# Patient Record
Sex: Male | Born: 1960 | Race: Black or African American | Hispanic: No | Marital: Married | Smoking: Never smoker
Health system: Southern US, Community
[De-identification: ages and names within clinical notes are randomized; demographics above are authoritative.]

## PROBLEM LIST (undated history)

## (undated) DIAGNOSIS — Z91018 Allergy to other foods: Principal | ICD-10-CM

## (undated) DIAGNOSIS — K219 Gastro-esophageal reflux disease without esophagitis: Secondary | ICD-10-CM

## (undated) DIAGNOSIS — I10 Essential (primary) hypertension: Secondary | ICD-10-CM

## (undated) DIAGNOSIS — K5792 Diverticulitis of intestine, part unspecified, without perforation or abscess without bleeding: Secondary | ICD-10-CM

## (undated) DIAGNOSIS — I313 Pericardial effusion (noninflammatory): Secondary | ICD-10-CM

## (undated) DIAGNOSIS — Z5189 Encounter for other specified aftercare: Secondary | ICD-10-CM

## (undated) DIAGNOSIS — T7840XA Allergy, unspecified, initial encounter: Secondary | ICD-10-CM

## (undated) DIAGNOSIS — G4730 Sleep apnea, unspecified: Secondary | ICD-10-CM

## (undated) HISTORY — DX: Essential (primary) hypertension: I10

## (undated) HISTORY — DX: Diverticulitis of intestine, part unspecified, without perforation or abscess without bleeding: K57.92

## (undated) HISTORY — PX: HERNIA REPAIR: SHX51

## (undated) HISTORY — DX: Gastro-esophageal reflux disease without esophagitis: K21.9

## (undated) HISTORY — DX: Sleep apnea, unspecified: G47.30

## (undated) HISTORY — DX: Pericardial effusion (noninflammatory): I31.3

## (undated) HISTORY — DX: Encounter for other specified aftercare: Z51.89

## (undated) HISTORY — PX: ACHILLES TENDON SURGERY: SHX542

## (undated) HISTORY — DX: Allergy, unspecified, initial encounter: T78.40XA

## (undated) HISTORY — DX: Allergy to other foods: Z91.018

---

## 2011-10-23 DIAGNOSIS — K5792 Diverticulitis of intestine, part unspecified, without perforation or abscess without bleeding: Secondary | ICD-10-CM

## 2011-10-23 HISTORY — DX: Diverticulitis of intestine, part unspecified, without perforation or abscess without bleeding: K57.92

## 2015-05-24 ENCOUNTER — Ambulatory Visit: Payer: Self-pay | Admitting: Family Medicine

## 2015-06-08 ENCOUNTER — Ambulatory Visit (INDEPENDENT_AMBULATORY_CARE_PROVIDER_SITE_OTHER): Payer: 59 | Admitting: Family Medicine

## 2015-06-08 ENCOUNTER — Encounter: Payer: Self-pay | Admitting: Family Medicine

## 2015-06-08 VITALS — BP 148/92 | HR 87 | Temp 98.1°F | Resp 18 | Ht 66.0 in | Wt 255.6 lb

## 2015-06-08 DIAGNOSIS — E7800 Pure hypercholesterolemia, unspecified: Secondary | ICD-10-CM

## 2015-06-08 DIAGNOSIS — R7309 Other abnormal glucose: Secondary | ICD-10-CM | POA: Diagnosis not present

## 2015-06-08 DIAGNOSIS — K219 Gastro-esophageal reflux disease without esophagitis: Secondary | ICD-10-CM | POA: Insufficient documentation

## 2015-06-08 DIAGNOSIS — G4733 Obstructive sleep apnea (adult) (pediatric): Secondary | ICD-10-CM

## 2015-06-08 DIAGNOSIS — I10 Essential (primary) hypertension: Secondary | ICD-10-CM

## 2015-06-08 DIAGNOSIS — E66813 Obesity, class 3: Secondary | ICD-10-CM

## 2015-06-08 DIAGNOSIS — R7303 Prediabetes: Secondary | ICD-10-CM

## 2015-06-08 DIAGNOSIS — Z125 Encounter for screening for malignant neoplasm of prostate: Secondary | ICD-10-CM

## 2015-06-08 DIAGNOSIS — F411 Generalized anxiety disorder: Secondary | ICD-10-CM

## 2015-06-08 DIAGNOSIS — Z8719 Personal history of other diseases of the digestive system: Secondary | ICD-10-CM | POA: Diagnosis not present

## 2015-06-08 DIAGNOSIS — B351 Tinea unguium: Secondary | ICD-10-CM

## 2015-06-08 MED ORDER — BUPROPION HCL ER (XL) 150 MG PO TB24: 150.0000 mg | ORAL_TABLET | Freq: Every day | ORAL | Status: DC

## 2015-06-08 MED ORDER — OMEPRAZOLE 40 MG PO CPDR: 40.0000 mg | DELAYED_RELEASE_CAPSULE | Freq: Every day | ORAL | Status: DC

## 2015-06-08 NOTE — Progress Notes (Signed)
Name: Kevin Marshall   MRN: [id number]    DOB: November 03, 1960   Date:06/08/2015       Progress Note  Subjective  Chief Complaint  Chief Complaint  Patient presents with  . Establish Care    HPI  Kevin Marshall is a 54 year old male who is here to establish care and discuss several things he needs addressed.  He has long standing sleep apnea for which he uses his CPAP machine however he may need a new device in the near future. He has already had his C-scope during an emergency in 2014 where he had unprovoked GI bleed which the source was difficult to determine. He had a C-scope, EGD and capsule camera testing done with no definitive findings so diverticular bleeding was accounted for the bleeding. He did need blood transfusion.  Other than that he has some current toe nail discoloration on 1st digits of both feet for which he would like treatment.  He has recent metabolic screening through [employer] which was reasonable other than pre-diabetes, elevated LDL cholesterol, his BMI and waist circumference and blood pressure. His max weight was around 280lbs and since has lost about 30 lbs with diet changes.   Hypertension: Patient is here for evaluation of elevated blood pressures.  Age at onset of elevated blood pressure: first noticed on his recent [facility] physical. Cardiac symptoms none. Patient denies chest pain, chest pressure/discomfort, claudication, dyspnea, exertional chest pressure/discomfort, irregular heart beat, lower extremity edema, near-syncope and palpitations.  Cardiovascular risk factors: hypertension, male gender, obesity (BMI >= 30 kg/m2) and sedentary lifestyle. Use of agents associated with hypertension: none. History of target organ damage: none.  GERD: Paitent complains of heartburn. This has been associated with abdominal bloating, belching, heartburn and midespigastric pain.  He denies chest pain, choking on food, cough, hoarseness, melena, nausea and nocturnal burning. Symptoms  have been present for several years. He denies dysphagia.  He has not lost weight un-intentionally. He denies melena, hematochezia, hematemesis, and coffee ground emesis. Medical therapy in the past has included antacids, H2 antagonists and proton pump inhibitors.  Needs refill.  Anxiety: Patient complains of anxiety disorder.  He has the following symptoms: difficulty concentrating, fatigue, feelings of losing control, insomnia, irritable, racing thoughts. Onset of symptoms was approximately several years ago, stable since that time. He denies current suicidal and homicidal ideation. Family history significant for anxiety and depression.Possible organic causes contributing are: none. 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  He complains of the following side effects from the treatment: none.  Wants to re-start Wellbutrin XR formulation preferred.         Patient Active Problem List   Diagnosis Date Noted  . Hypertension goal BP (blood pressure) < 140/90 06/08/2015  . GERD without esophagitis 06/08/2015  . Depression with anxiety 06/08/2015    Social History  Substance Use Topics  . Smoking status: Never Smoker   . Smokeless tobacco: Not on file  . Alcohol Use: 0.0 oz/week    0 Standard drinks or equivalent per week     Comment: social     Current outpatient prescriptions:  .  buPROPion (WELLBUTRIN) 75 MG tablet, Take 75 mg by mouth 2 (two) times daily., Disp: , Rfl:  .  esomeprazole (NEXIUM) 40 MG capsule, Take 40 mg by mouth daily at 12 noon., Disp: , Rfl:   Past Surgical History  Procedure Laterality Date  . Achilles tendon surgery    . Hernia repair  3    Family History  Problem Relation Age of Onset  . Diabetes Mother   . Hypertension Father     No Known Allergies   Review of Systems  CONSTITUTIONAL: No fever, chills, weakness or fatigue.  HEENT:  - Eyes: No visual changes.  - Ears: No auditory changes. No  pain.  - Nose: No sneezing, congestion, runny nose. - Throat: No sore throat. No changes in swallowing. SKIN: No rash or itching.  CARDIOVASCULAR: No chest pain, chest pressure or chest discomfort. No palpitations or edema.  RESPIRATORY: No shortness of breath, cough or sputum.  GASTROINTESTINAL: No anorexia, nausea, vomiting. No changes in bowel habits. No abdominal pain or blood.  GENITOURINARY: No dysuria. No frequency. No discharge. NEUROLOGICAL: No headache, dizziness, syncope, paralysis, ataxia, numbness or tingling in the extremities. No memory changes. No change in bowel or bladder control.  MUSCULOSKELETAL: No joint pain. No muscle pain. HEMATOLOGIC: No anemia, bleeding or bruising.  LYMPHATICS: No enlarged lymph nodes.  PSYCHIATRIC: No change in mood. No change in sleep pattern.  ENDOCRINOLOGIC: No reports of sweating, cold or heat intolerance. No polyuria or polydipsia.     Objective  BP 148/92 mmHg  Pulse 87  Temp(Src) 98.1 F (36.7 C) (Oral)  Resp 18  Ht 5\' 6"  (1.676 m)  Wt 255 lb 9.6 oz (115.939 kg)  BMI 41.27 kg/m2  SpO2 98% Body mass index is 41.27 kg/(m^2).  Physical Exam  Constitutional: Patient is obese and well-nourished. In no distress.  HEENT:  - Head: Normocephalic and atraumatic.  - Ears: Bilateral TMs gray, no erythema or effusion - Nose: Nasal mucosa moist - Mouth/Throat: Oropharynx is clear and moist. No tonsillar hypertrophy or erythema. No post nasal drainage.  - Eyes: Conjunctivae clear, EOM movements normal. PERRLA. No scleral icterus.  Neck: Normal range of motion. Neck supple. No JVD present. No thyromegaly present.  Cardiovascular: Normal rate, regular rhythm and normal heart sounds.  No murmur heard.  Pulmonary/Chest: Effort normal and breath sounds normal. No respiratory distress. Musculoskeletal: Normal range of motion bilateral UE and LE, no joint effusions. Peripheral vascular: Bilateral LE no edema. Neurological: CN II-XII grossly  intact with no focal deficits. Alert and oriented to person, place, and time. Coordination, balance, strength, speech and gait are normal.  Skin: Skin is warm and dry. No rash noted. No erythema. Bilateral 1st digit nails of both feet discolored brown and mildly brittle.  Psychiatric: Patient has a normal mood and affect. Behavior is normal in office today. Judgment and thought content normal in office today.   Assessment & Plan  1. Hypertension goal BP (blood pressure) < 140/90 New diagnosis. He wants to try weight loss and lifestyle changes first but I have educated him on long term consequences of elevated BP especially diastolic and risks of heart failure.   - Hepatic function panel - TSH  2. GERD without esophagitis Clinically stable findings based on clinical exam and on review of any pertinent results. Recommended to patient that they continue their current regimen with regular follow ups.  - omeprazole (PRILOSEC) 40 MG capsule; Take 1 capsule (40 mg total) by mouth daily.  Dispense: 30 capsule; Refill: 3  3. GAD (generalized anxiety disorder) Restart Wellbutrin.  - buPROPion (WELLBUTRIN XL) 150 MG 24 hr tablet; Take 1 tablet (150 mg total) by mouth daily.  Dispense: 30 tablet; Refill: 3  4. Obstructive sleep apnea of adult Weight loss recommended.  5. Onychomycosis of toenail If LFTs reasonable on blood work I  will start him on Terbinafine.   6. History of GI diverticular bleed Clinically stable findings based on clinical exam and on review of any pertinent results. Recommended repeat C-scope which will be ordered during upcoming CPE.    7. Obesity, Class III, BMI 40-49.9 (morbid obesity) The patient has been counseled on their higher than normal BMI.  They have verbally expressed understanding their increased risk for other diseases.  In efforts to meet a better target BMI goal the patient has been counseled on lifestyle, diet and exercise modification tactics. Start with  moderate intensity aerobic exercise (walking, jogging, elliptical, swimming, group or individual sports, hiking) at least a day at least 4 days a week and increase intensity, duration, frequency as tolerated. Diet should include well balance fresh fruits and vegetables avoiding processed foods, carbohydrates and sugars. Drink at least 8oz 10 glasses a day avoiding sodas, sugary fruit drinks, sweetened tea. Check weight on a reliable scale daily and monitor weight loss progress daily. Consider investing in mobile phone apps that will help keep track of weight loss goals.  - Hepatic function panel  8. Pre-diabetes Weight loss recommended.   9. Elevated LDL cholesterol level Weight loss recommended.   10. Prostate cancer screening  - PSA

## 2015-06-09 ENCOUNTER — Encounter: Payer: Self-pay | Admitting: Family Medicine

## 2015-06-18 LAB — HEPATIC FUNCTION PANEL
ALK PHOS: 86 IU/L (ref 39–117)
ALT: 35 IU/L (ref 0–44)
AST: 31 IU/L (ref 0–40)
Albumin: 4.6 g/dL (ref 3.5–5.5)
BILIRUBIN, DIRECT: 0.14 mg/dL (ref 0.00–0.40)
Bilirubin Total: 0.5 mg/dL (ref 0.0–1.2)
TOTAL PROTEIN: 7.8 g/dL (ref 6.0–8.5)

## 2015-06-18 LAB — PSA: PROSTATE SPECIFIC AG, SERUM: 0.5 ng/mL (ref 0.0–4.0)

## 2015-06-18 LAB — TSH: TSH: 1.7 u[IU]/mL (ref 0.450–4.500)

## 2015-07-01 ENCOUNTER — Ambulatory Visit (INDEPENDENT_AMBULATORY_CARE_PROVIDER_SITE_OTHER): Payer: 59 | Admitting: Family Medicine

## 2015-07-01 ENCOUNTER — Encounter: Payer: Self-pay | Admitting: Family Medicine

## 2015-07-01 VITALS — BP 156/108 | HR 87 | Temp 98.5°F | Resp 18 | Wt 256.9 lb

## 2015-07-01 DIAGNOSIS — L309 Dermatitis, unspecified: Secondary | ICD-10-CM | POA: Diagnosis not present

## 2015-07-01 DIAGNOSIS — B351 Tinea unguium: Secondary | ICD-10-CM | POA: Diagnosis not present

## 2015-07-01 DIAGNOSIS — I10 Essential (primary) hypertension: Secondary | ICD-10-CM

## 2015-07-01 MED ORDER — LISINOPRIL 20 MG PO TABS: 20.0000 mg | ORAL_TABLET | Freq: Every day | ORAL | Status: DC

## 2015-07-01 MED ORDER — TERBINAFINE HCL 250 MG PO TABS: 250.0000 mg | ORAL_TABLET | Freq: Every day | ORAL | Status: DC

## 2015-07-01 MED ORDER — CLOTRIMAZOLE-BETAMETHASONE 1-0.05 % EX LOTN: TOPICAL_LOTION | Freq: Two times a day (BID) | CUTANEOUS | Status: DC

## 2015-07-01 NOTE — Progress Notes (Signed)
Name: Kevin Marshall   MRN: [id number]    DOB: 06/26/1961   Date:07/01/2015       Progress Note  Subjective  Chief Complaint  Chief Complaint  Patient presents with  . Advice Only    patient needs form completed for work  . Medication Management    HPI  Mr. Kevin Marshall is a 54 year old male who is here accompanied by his wife  to get [facility] form filled out regarding his BMI.  He has recent metabolic screening through [employer] [facility] which was reasonable other than pre-diabetes, elevated LDL cholesterol, his BMI and waist circumference and blood pressure. His max weight was around 280lbs and since has lost about 30 lbs with diet changes. He also has OSA, history of spontaneous GI bleed, GERD, anxiety, eczema.  Eczema manifests as dry patches of skin over eye browns, sides of nose and on earlobes. Needs topical lotion refilled. Other than that he has some current toe nail discoloration on 1st digits of both feet for which he would like treatment.Onset over 1 year ago. Has tried OTC topical solutions which have not helped much.  Kevin Marshall is also here for evaluation of elevated blood pressures. Age at onset of elevated blood pressure: first noticed on his recent [facility] physical. Cardiac symptoms none. Patient denies chest pain, chest pressure/discomfort, claudication, dyspnea, exertional chest pressure/discomfort, irregular heart beat, lower extremity edema, near-syncope and palpitations. Cardiovascular risk factors: hypertension, male gender, obesity (BMI >= 30 kg/m2) and sedentary lifestyle. Use of agents associated with hypertension: none. History of target organ damage: none.  He is agreeable to starting anti-HTN medication today as his BP has been elevated on at least 3 different recorded occasions.    Patient Active Problem List   Diagnosis Date Noted  . Hypertension goal BP (blood pressure) < 140/90 06/08/2015  . GERD without esophagitis 06/08/2015  . GAD (generalized anxiety disorder)  06/08/2015  . Obstructive sleep apnea of adult 06/08/2015  . Onychomycosis of toenail 06/08/2015  . History of GI diverticular bleed 06/08/2015  . Obesity, Class III, BMI 40-49.9 (morbid obesity) 06/08/2015  . Pre-diabetes 06/08/2015  . Elevated LDL cholesterol level 06/08/2015  . Prostate cancer screening 06/08/2015    Social History  Substance Use Topics  . Smoking status: Never Smoker   . Smokeless tobacco: Not on file  . Alcohol Use: 0.0 oz/week    0 Standard drinks or equivalent per week     Comment: social     Current outpatient prescriptions:  .  buPROPion (WELLBUTRIN XL) 150 MG 24 hr tablet, Take 1 tablet (150 mg total) by mouth daily., Disp: 30 tablet, Rfl: 3 .  omeprazole (PRILOSEC) 40 MG capsule, Take 1 capsule (40 mg total) by mouth daily., Disp: 30 capsule, Rfl: 3  Past Surgical History  Procedure Laterality Date  . Achilles tendon surgery    . Hernia repair      3    Family History  Problem Relation Age of Onset  . Diabetes Mother   . Hypertension Father     No Known Allergies   Review of Systems  CONSTITUTIONAL: No significant weight changes, fever, chills, weakness or fatigue.  HEENT:  - Eyes: No visual changes.  - Ears: No auditory changes. No pain.  - Nose: No sneezing, congestion, runny nose. - Throat: No sore throat. No changes in swallowing. SKIN: No rash or itching. Propensity for eczema, nail changes.   CARDIOVASCULAR: No chest pain, chest pressure or chest discomfort. No palpitations or edema.  RESPIRATORY: No shortness of breath, cough or sputum.  GASTROINTESTINAL: No anorexia, nausea, vomiting. No changes in bowel habits. No abdominal pain or blood.  GENITOURINARY: No dysuria. No frequency. No discharge.  NEUROLOGICAL: No headache, dizziness, syncope, paralysis, ataxia, numbness or tingling in the extremities. No memory changes. No change in bowel or bladder control.  MUSCULOSKELETAL: No joint pain. No muscle pain. HEMATOLOGIC: No  anemia, bleeding or bruising.  LYMPHATICS: No enlarged lymph nodes.  PSYCHIATRIC: No change in mood. No change in sleep pattern.  ENDOCRINOLOGIC: No reports of sweating, cold or heat intolerance. No polyuria or polydipsia.     Objective  BP 156/108 mmHg  Pulse 87  Temp(Src) 98.5 F (36.9 C) (Oral)  Resp 18  Wt 256 lb 14.4 oz (116.529 kg)  SpO2 96% Body mass index is 41.48 kg/(m^2).  Physical Exam  Constitutional: Patient is obese and well-nourished. In no distress.  HEENT:  - Head: Normocephalic and atraumatic.  - Ears: Bilateral TMs gray, no erythema or effusion - Nose: Nasal mucosa moist - Mouth/Throat: Oropharynx is clear and moist. No tonsillar hypertrophy or erythema. No post nasal drainage.  - Eyes: Conjunctivae clear, EOM movements normal. PERRLA. No scleral icterus.  Neck: Normal range of motion. Neck supple. No JVD present. No thyromegaly present.  Cardiovascular: Normal rate, regular rhythm and normal heart sounds.  No murmur heard.  Pulmonary/Chest: Effort normal and breath sounds normal. No respiratory distress. Musculoskeletal: Normal range of motion bilateral UE and LE, no joint effusions. Peripheral vascular: Bilateral LE no edema. Neurological: CN II-XII grossly intact with no focal deficits. Alert and oriented to person, place, and time. Coordination, balance, strength, speech and gait are normal.  Skin: Skin on face with patches of mild dryness at forehead and earlobes. Bilateral feet 1st digit nails discolored brown, thick, mildly brittle.  Psychiatric: Patient has a stable mood and affect. Behavior is normal in office today. Judgment and thought content normal in office today.   Recent Results (from the past 2160 hour(s))  Hepatic function panel     Status: None   Collection Time: 06/17/15  3:19 PM  Result Value Ref Range   Total Protein 7.8 6.0 - 8.5 g/dL   Albumin 4.6 3.5 - 5.5 g/dL   Bilirubin Total 0.5 0.0 - 1.2 mg/dL   Bilirubin, Direct 1.61 0.00 -  0.40 mg/dL   Alkaline Phosphatase 86 39 - 117 IU/L   AST 31 0 - 40 IU/L   ALT 35 0 - 44 IU/L  PSA     Status: None   Collection Time: 06/17/15  3:19 PM  Result Value Ref Range   Prostate Specific Ag, Serum 0.5 0.0 - 4.0 ng/mL    Comment: Roche ECLIA methodology. According to the [employer] U[employer] Association, Serum PSA should decrease and remain at undetectable levels after radical prostatectomy. The AUA defines biochemical recurrence as an initial PSA value 0.2 ng/mL or greater followed by a subsequent confirmatory PSA value 0.2 ng/mL or greater. Values obtained with different assay methods or kits cannot be used interchangeably. Results cannot be interpreted as absolute evidence of the presence or absence of malignant disease.   TSH     Status: None   Collection Time: 06/17/15  3:19 PM  Result Value Ref Range   TSH 1.700 0.450 - 4.500 uIU/mL     Assessment & Plan  1. Obesity, Class III, BMI 40-49.9 (morbid obesity) [facility] form reviewed and signed with physician recommended weight loss plan. Recommended routine follow up 3-4 months for  surveillance of weight loss.   The patient has been counseled on their higher than normal BMI.  They have verbally expressed understanding their increased risk for other diseases.  In efforts to meet a better target BMI goal the patient has been counseled on lifestyle, diet and exercise modification tactics. Start with moderate intensity aerobic exercise (walking, jogging, elliptical, swimming, group or individual sports, hiking) at least a day at least 4 days a week and increase intensity, duration, frequency as tolerated. Diet should include well balance fresh fruits and vegetables avoiding processed foods, carbohydrates and sugars. Drink at least 8oz 10 glasses a day avoiding sodas, sugary fruit drinks, sweetened tea. Check weight on a reliable scale daily and monitor weight loss progress daily. Consider investing in mobile phone apps that  will help keep track of weight loss goals.   2. Onychomycosis of toenail Trial of Terbinafine. The patient has been counseled on the proper use, side effects and potential interactions of the new medication. Patient encouraged to review the side effects and safety profile pamphlet provided with the prescription from the pharmacy as well as request counseling from the pharmacy team as needed.   - terbinafine (LAMISIL) 250 MG tablet; Take 1 tablet (250 mg total) by mouth daily.  Dispense: 90 tablet; Refill: 0  3. Hypertension goal BP (blood pressure) < 140/90 Sub optimal control. Start Lisinopril 20 mg one a day. The patient has been counseled on the proper use, side effects and potential interactions of the new medication. Patient encouraged to review the side effects and safety profile pamphlet provided with the prescription from the pharmacy as well as request counseling from the pharmacy team as needed.   - lisinopril (PRINIVIL,ZESTRIL) 20 MG tablet; Take 1 tablet (20 mg total) by mouth daily.  Dispense: 90 tablet; Refill: 3  4. Eczematous dermatitis Well controled with current treatment. Refill topical treatment.   - clotrimazole-betamethasone (LOTRISONE) lotion; Apply topically 2 (two) times daily.  Dispense: 30 mL; Refill: 0

## 2015-07-12 ENCOUNTER — Encounter: Payer: Self-pay | Admitting: Family Medicine

## 2015-07-12 ENCOUNTER — Ambulatory Visit (INDEPENDENT_AMBULATORY_CARE_PROVIDER_SITE_OTHER): Payer: 59 | Admitting: Family Medicine

## 2015-07-12 VITALS — BP 142/86 | HR 90 | Temp 98.1°F | Resp 18 | Ht 66.0 in | Wt 259.0 lb

## 2015-07-12 DIAGNOSIS — Z23 Encounter for immunization: Secondary | ICD-10-CM

## 2015-07-12 DIAGNOSIS — Z8719 Personal history of other diseases of the digestive system: Secondary | ICD-10-CM | POA: Insufficient documentation

## 2015-07-12 DIAGNOSIS — Z0000 Encounter for general adult medical examination without abnormal findings: Secondary | ICD-10-CM

## 2015-07-12 DIAGNOSIS — Z9889 Other specified postprocedural states: Secondary | ICD-10-CM

## 2015-07-12 NOTE — Progress Notes (Signed)
Name: Kevin Marshall   MRN: [id number]    DOB: 1961-02-22   Date:07/12/2015       Progress Note  Subjective  Chief Complaint  Chief Complaint  Patient presents with  . Annual Exam    HPI  Patient is here today for a Complete Male Physical Exam:  The patient has no acute concerns. Overall feels that his health needs are heading in the right direction. Working on getting diet to be more well balanced. In general does not exercise regularly. Sees dentist regularly and addresses vision concerns with ophthalmologist if applicable. In regards to sexual activity the patient is currently sexually active. Currently is not concerned about exposure to any STDs.    Past Medical History  Diagnosis Date  . Allergy     history of as a child, did take shots  . Blood transfusion without reported diagnosis   . GERD (gastroesophageal reflux disease)   . Sleep apnea   . Hypertension     borderline history of  . Diverticulitis 2013    patient was hospitalized for over a week for this    Past Surgical History  Procedure Laterality Date  . Achilles tendon surgery    . Hernia repair      3    Family History  Problem Relation Age of Onset  . Diabetes Mother   . Hypertension Father     Social History   Social History  . Marital Status: Married    Spouse Name: N/A  . Number of Children: 4  . Years of Education: N/A   Occupational History  . 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 Smoking status: Never Smoker   . Smokeless tobacco: Not on file  . Alcohol Use: 0.0 oz/week    0 Standard drinks or equivalent per week     Comment: social  . Drug Use: No  . Sexual Activity:    Partners: Female    Copy: None   Other Topics Concern  . Not on file   Social History Narrative     Current outpatient prescriptions:  .  buPROPion (WELLBUTRIN XL) 150 MG 24 hr tablet, Take 1 tablet (150 mg total) by mouth daily., Disp: 30 tablet, Rfl: 3 .   clotrimazole-betamethasone (LOTRISONE) lotion, Apply topically 2 (two) times daily., Disp: 30 mL, Rfl: 0 .  lisinopril (PRINIVIL,ZESTRIL) 20 MG tablet, Take 1 tablet (20 mg total) by mouth daily., Disp: 90 tablet, Rfl: 3 .  omeprazole (PRILOSEC) 40 MG capsule, Take 1 capsule (40 mg total) by mouth daily., Disp: 30 capsule, Rfl: 3 .  terbinafine (LAMISIL) 250 MG tablet, Take 1 tablet (250 mg total) by mouth daily., Disp: 90 tablet, Rfl: 0  No Known Allergies  ROS  CONSTITUTIONAL: No significant weight changes, fever, chills, weakness or fatigue.  HEENT:  - Eyes: No visual changes.  - Ears: No auditory changes. No pain.  - Nose: No sneezing, congestion, runny nose. - Throat: No sore throat. No changes in swallowing. SKIN: No rash or itching.  CARDIOVASCULAR: No chest pain, chest pressure or chest discomfort. No palpitations or edema.  RESPIRATORY: No shortness of breath, cough or sputum.  GASTROINTESTINAL: No anorexia, nausea, vomiting. No changes in bowel habits. No abdominal pain or blood.  GENITOURINARY: No dysuria. No frequency. No discharge.  NEUROLOGICAL: No headache, dizziness, syncope, paralysis, ataxia, numbness or tingling in the extremities. No memory changes. No change in bowel or bladder control.  MUSCULOSKELETAL: No joint pain. No  muscle pain. HEMATOLOGIC: No anemia, bleeding or bruising.  LYMPHATICS: No enlarged lymph nodes.  PSYCHIATRIC: No change in mood. No change in sleep pattern.  ENDOCRINOLOGIC: No reports of sweating, cold or heat intolerance. No polyuria or polydipsia.   Objective  Filed Vitals:   07/12/15 1433  BP: 142/86  Pulse: 90  Temp: 98.1 F (36.7 C)  TempSrc: Oral  Resp: 18  Height:  (1.676 m)  Weight: 259 lb (117.482 kg)  SpO2: 99%   Body mass index is 41.82 kg/(m^2).   Depression screen [facility] 2/9 07/12/2015 06/08/2015  Decreased Interest 0 0  Down, Depressed, Hopeless 0 0  PHQ - 2 Score 0 0      Recent Results (from the past 2160  hour(s))  Hepatic function panel     Status: None   Collection Time: 06/17/15  3:19 PM  Result Value Ref Range   Total Protein 7.8 6.0 - 8.5 g/dL   Albumin 4.6 3.5 - 5.5 g/dL   Bilirubin Total 0.5 0.0 - 1.2 mg/dL   Bilirubin, Direct 1.61 0.00 - 0.40 mg/dL   Alkaline Phosphatase 86 39 - 117 IU/L   AST 31 0 - 40 IU/L   ALT 35 0 - 44 IU/L  PSA     Status: None   Collection Time: 06/17/15  3:19 PM  Result Value Ref Range   Prostate Specific Ag, Serum 0.5 0.0 - 4.0 ng/mL    Comment: Roche ECLIA methodology. According to the American Urological Association, Serum PSA should decrease and remain at undetectable levels after radical prostatectomy. The AUA defines biochemical recurrence as an initial PSA value 0.2 ng/mL or greater followed by a subsequent confirmatory PSA value 0.2 ng/mL or greater. Values obtained with different assay methods or kits cannot be used interchangeably. Results cannot be interpreted as absolute evidence of the presence or absence of malignant disease.   TSH     Status: None   Collection Time: 06/17/15  3:19 PM  Result Value Ref Range   TSH 1.700 0.450 - 4.500 uIU/mL    Physical Exam  Constitutional: Patient is obese and well-nourished. In no distress.  HEENT:  - Head: Normocephalic and atraumatic.  - Ears: Bilateral TMs gray, no erythema or effusion - Nose: Nasal mucosa moist - Mouth/Throat: Oropharynx is clear and moist. No tonsillar hypertrophy or erythema. No post nasal drainage.  - Eyes: Conjunctivae clear, EOM movements normal. PERRLA. No scleral icterus.  Neck: Normal range of motion. Neck supple. No JVD present. No thyromegaly present.  Cardiovascular: Normal rate, regular rhythm and normal heart sounds.  No murmur heard.  Pulmonary/Chest: Effort normal and breath sounds normal. No respiratory distress. Abdominal: Soft, obese. Bowel sounds are normal, no distension. There is no tenderness. no masses Breast: Bilateral breast exam normal with no  masses, skin changes or nipple discharge MALE GENITALIA: Bilateral testes descended with no masses, no penile lesions, no penile discharge. PROSTATE: Deferred RECTAL: Deferred  Musculoskeletal: Normal range of motion bilateral UE and LE, no joint effusions. Peripheral vascular: Bilateral LE no edema. Neurological: CN II-XII grossly intact with no focal deficits. Alert and oriented to person, place, and time. Coordination, balance, strength, speech and gait are normal.  Skin: Skin is warm and dry. No rash noted. No erythema. Pedunculated skin mass at left hip and left mid back.  Brittle thick and discolored nails 1st digit bilateral feet. Psychiatric: Patient has a stable mood and affect. Behavior is normal in office today. Judgment and thought content normal in office  today.    Assessment & Plan  1. Annual physical exam Colonoscopy done after age 11 yo with benign polyps found, repeat in 5 years. Discussed preventative measures recommended for age.   2. Need for immunization against influenza  - Flu Vaccine QUAD 36+ mos PF IM (Fluarix & Fluzone Quad PF)

## 2015-07-21 ENCOUNTER — Ambulatory Visit (INDEPENDENT_AMBULATORY_CARE_PROVIDER_SITE_OTHER): Payer: 59 | Admitting: Family Medicine

## 2015-07-21 ENCOUNTER — Encounter: Payer: Self-pay | Admitting: Family Medicine

## 2015-07-21 VITALS — BP 152/94 | HR 91 | Temp 97.9°F | Resp 18 | Ht 66.0 in | Wt 259.0 lb

## 2015-07-21 DIAGNOSIS — I10 Essential (primary) hypertension: Secondary | ICD-10-CM

## 2015-07-21 DIAGNOSIS — G4700 Insomnia, unspecified: Secondary | ICD-10-CM | POA: Insufficient documentation

## 2015-07-21 DIAGNOSIS — E049 Nontoxic goiter, unspecified: Secondary | ICD-10-CM | POA: Diagnosis not present

## 2015-07-21 DIAGNOSIS — I452 Bifascicular block: Secondary | ICD-10-CM | POA: Diagnosis not present

## 2015-07-21 DIAGNOSIS — Z1159 Encounter for screening for other viral diseases: Secondary | ICD-10-CM

## 2015-07-21 DIAGNOSIS — E010 Iodine-deficiency related diffuse (endemic) goiter: Secondary | ICD-10-CM

## 2015-07-21 DIAGNOSIS — Z23 Encounter for immunization: Secondary | ICD-10-CM | POA: Diagnosis not present

## 2015-07-21 DIAGNOSIS — F411 Generalized anxiety disorder: Secondary | ICD-10-CM

## 2015-07-21 MED ORDER — AMLODIPINE BESY-BENAZEPRIL HCL 5-20 MG PO CAPS: 1.0000 | ORAL_CAPSULE | Freq: Every day | ORAL | Status: DC

## 2015-07-21 MED ORDER — TRAZODONE HCL 50 MG PO TABS: 50.0000 mg | ORAL_TABLET | Freq: Every day | ORAL | Status: DC

## 2015-07-21 NOTE — Progress Notes (Signed)
Name: Kevin Marshall   MRN: [id number]    DOB: 09/12/1961   Date:07/21/2015       Progress Note  Subjective  Chief Complaint  Chief Complaint  Patient presents with  . Hypertension    Checked BP this morning at work with a brachial cuff and got 195/118. Patient has had no symptoms just wanted to check it since his stress level has been elevated.  . Stress    Has been stressed due to work, children coming to visit and getting everything together. Also wife has some health issues and been worried about her.     HPI  HTN: he was started on Lisinopril by Dr. Sherley Bounds and bp had improved when checked on his labs visit.  He woke up this am, feeling well, but decided to check his bp at home and it was very high at home at 195/118. He denies headache or chest pain, no neurological deficit. BP at work was also elevated, and he got very worried and decided to come in to be checked out.  He has not had any recent kidney function test and since recently started on lisinopril we will check the levels.   Insomnia: he has OSA, but has noticed that he wakes up around 4 am. Sometimes he is able to fall asleep. He wakes up and his mind is busy, worries about children, work.   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 He has a child that is bipolar and worries about her a lot. He is under a lot of stress at work also.    Patient Active Problem List   Diagnosis Date Noted  . Insomnia 07/21/2015  . Annual physical exam 07/12/2015  . History of hernia repair 07/12/2015  . Onychomycosis of toenail 07/01/2015  . Eczematous dermatitis 07/01/2015  . Hypertension goal BP (blood pressure) < 140/90 06/08/2015  . GERD without esophagitis 06/08/2015  . GAD (generalized anxiety disorder) 06/08/2015  . Obstructive sleep apnea of adult 06/08/2015  . History of GI diverticular bleed 06/08/2015  . Obesity, Class III, BMI 40-49.9 (morbid obesity) 06/08/2015  . Pre-diabetes 06/08/2015  .  Elevated LDL cholesterol level 06/08/2015  . Prostate cancer screening 06/08/2015    Past Surgical History  Procedure Laterality Date  . Achilles tendon surgery    . Hernia repair      3    Family History  Problem Relation Age of Onset  . Diabetes Mother   . Hypertension Father     Social History   Social History  . Marital Status: Married    Spouse Name: N/A  . Number of Children: 4  . Years of Education: N/A   Occupational History  . 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 Smoking status: Never Smoker   . Smokeless tobacco: Never Used  . Alcohol Use: 1.2 oz/week    0 Standard drinks or equivalent, 2 Glasses of wine per week     Comment: wine nightly  . Drug Use: No  . Sexual Activity:    Partners: Female    Copy: None   Other Topics Concern  . Not on file   Social History Narrative     Current outpatient prescriptions:  .  buPROPion (WELLBUTRIN XL) 150 MG 24 hr tablet, Take 1 tablet (150 mg total) by mouth daily., Disp: 30 tablet, Rfl: 3 .  clotrimazole-betamethasone (LOTRISONE) lotion, Apply topically 2 (two) times daily., Disp: 30 mL,  Rfl: 0 .  omeprazole (PRILOSEC) 40 MG capsule, Take 1 capsule (40 mg total) by mouth daily., Disp: 30 capsule, Rfl: 3 .  terbinafine (LAMISIL) 250 MG tablet, Take 1 tablet (250 mg total) by mouth daily., Disp: 90 tablet, Rfl: 0 .  amLODipine-benazepril (LOTREL) 5-20 MG capsule, Take 1 capsule by mouth daily. In place of lisinopril for BP, Disp: 30 capsule, Rfl: 0 .  traZODone (DESYREL) 50 MG tablet, Take 1 tablet (50 mg total) by mouth at bedtime. Start at half pill for three night and titrate up to  qhs, Disp: 45 tablet, Rfl: 0  No Known Allergies   ROS  Constitutional: Negative for fever or weight change.  Respiratory: Negative for cough and shortness of breath.   Cardiovascular: Negative for chest pain or palpitations.  Gastrointestinal: Negative for abdominal pain,  no bowel changes.  Musculoskeletal: Negative for gait problem or joint swelling.  Skin: Negative for rash.  Neurological: Negative for dizziness or headache.  No other specific complaints in a complete review of systems (except as listed in HPI above).  Objective  Filed Vitals:   07/21/15 0953  BP: 178/118  Pulse: 91  Temp: 97.9 F (36.6 C)  TempSrc: Oral  Resp: 18  Height:  (1.676 m)  Weight: 259 lb (117.482 kg)  SpO2: 98%    Body mass index is 41.82 kg/(m^2).  Physical Exam  Constitutional: Patient appears well-developed and well-nourished. Obese  No distress.  HEENT: head atraumatic, normocephalic, pupils equal and reactive to light, neck supple, enlarged thyroid on exam,  throat within normal limits Cardiovascular: Normal rate, regular rhythm and normal heart sounds.  No murmur heard. No BLE edema. Pulmonary/Chest: Effort normal and breath sounds normal. No respiratory distress. Abdominal: Soft.  There is no tenderness. Psychiatric: Patient has a normal mood and affect. behavior is normal. Judgment and thought content normal.  Recent Results (from the past 2160 hour(s))  Hepatic function panel     Status: None   Collection Time: 06/17/15  3:19 PM  Result Value Ref Range   Total Protein 7.8 6.0 - 8.5 g/dL   Albumin 4.6 3.5 - 5.5 g/dL   Bilirubin Total 0.5 0.0 - 1.2 mg/dL   Bilirubin, Direct 1.61 0.00 - 0.40 mg/dL   Alkaline Phosphatase 86 39 - 117 IU/L   AST 31 0 - 40 IU/L   ALT 35 0 - 44 IU/L  PSA     Status: None   Collection Time: 06/17/15  3:19 PM  Result Value Ref Range   Prostate Specific Ag, Serum 0.5 0.0 - 4.0 ng/mL    Comment: Roche ECLIA methodology. According to the American Urological Association, Serum PSA should decrease and remain at undetectable levels after radical prostatectomy. The AUA defines biochemical recurrence as an initial PSA value 0.2 ng/mL or greater followed by a subsequent confirmatory PSA value 0.2 ng/mL or greater. Values  obtained with different assay methods or kits cannot be used interchangeably. Results cannot be interpreted as absolute evidence of the presence or absence of malignant disease.   TSH     Status: None   Collection Time: 06/17/15  3:19 PM  Result Value Ref Range   TSH 1.700 0.450 - 4.500 uIU/mL    PHQ2/9: Depression screen [facility] 2/9 07/12/2015 06/08/2015  Decreased Interest 0 0  Down, Depressed, Hopeless 0 0  PHQ - 2 Score 0 0     Fall Risk: Fall Risk  07/12/2015 06/08/2015  Falls in the past year? No No  Assessment & Plan  1. Hypertension goal BP (blood pressure) < 140/90  Switch to Lotrel from lisinopril , discussed DASH, needs exercise at least 30 minutes daily  - amLODipine-benazepril (LOTREL) 5-20 MG capsule; Take 1 capsule by mouth daily. In place of lisinopril for BP  Dispense: 30 capsule; Refill: 0 - Basic Metabolic Panel (BMET) - CBC with Differential/Platelet  2. GAD (generalized anxiety disorder)  On Wellbutrin, but still very anxious, we will add Trazodone qhs  3. Need for Tdap vaccination  - Tdap vaccine greater than or equal to 7yo IM  4. Insomnia  - traZODone (DESYREL) 50 MG tablet; Take 1 tablet (50 mg total) by mouth at bedtime. Start at half pill for three night and titrate up to  qhs  Dispense: 45 tablet; Refill: 0  5. Need for hepatitis C screening test  - Hepatitis C antibody   6. RBBB (right bundle branch block with left anterior fascicular block)  Discussed results with patient, we will let Dr. Sherley Bounds decide if he should see cardiologist.   7. Thyromegaly  - US Soft Tissue Head/Neck; Future

## 2015-07-21 NOTE — Patient Instructions (Signed)
DASH Eating Plan °DASH stands for "Dietary Approaches to Stop Hypertension." The DASH eating plan is a healthy eating plan that has been shown to reduce high blood pressure (hypertension). Additional health benefits may include reducing the risk of type 2 diabetes mellitus, heart disease, and stroke. The DASH eating plan may also help with weight loss. °WHAT DO I NEED TO KNOW ABOUT THE DASH EATING PLAN? °For the DASH eating plan, you will follow these general guidelines: °· Choose foods with a percent daily value for sodium of less than 5% (as listed on the food label). °· Use salt-free seasonings or herbs instead of table salt or sea salt. °· Check with your health care provider or pharmacist before using salt substitutes. °· Eat lower-sodium products, often labeled as "lower sodium" or "no salt added." °· Eat fresh foods. °· Eat more vegetables, fruits, and low-fat dairy products. °· Choose whole grains. Look for the word "whole" as the first word in the ingredient list. °· Choose fish and skinless chicken or turkey more often than red meat. Limit fish, poultry, and meat to 6 oz (170 g) each day. °· Limit sweets, desserts, sugars, and sugary drinks. °· Choose heart-healthy fats. °· Limit cheese to 1 oz (28 g) per day. °· Eat more home-cooked food and less restaurant, buffet, and fast food. °· Limit fried foods. °· Cook foods using methods other than frying. °· Limit canned vegetables. If you do use them, rinse them well to decrease the sodium. °· When eating at a restaurant, ask that your food be prepared with less salt, or no salt if possible. °WHAT FOODS CAN I EAT? °Seek help from a dietitian for individual calorie needs. °Grains °Whole grain or whole wheat bread. Brown rice. Whole grain or whole wheat pasta. Quinoa, bulgur, and whole grain cereals. Low-sodium cereals. Corn or whole wheat flour tortillas. Whole grain cornbread. Whole grain crackers. Low-sodium crackers. °Vegetables °Fresh or frozen vegetables  (raw, steamed, roasted, or grilled). Low-sodium or reduced-sodium tomato and vegetable juices. Low-sodium or reduced-sodium tomato sauce and paste. Low-sodium or reduced-sodium canned vegetables.  °Fruits °All fresh, canned (in natural juice), or frozen fruits. °Meat and Other Protein Products °Ground beef (85% or leaner), grass-fed beef, or beef trimmed of fat. Skinless chicken or turkey. Ground chicken or turkey. Pork trimmed of fat. All fish and seafood. Eggs. Dried beans, peas, or lentils. Unsalted nuts and seeds. Unsalted canned beans. °Dairy °Low-fat dairy products, such as skim or 1% milk, 2% or reduced-fat cheeses, low-fat ricotta or cottage cheese, or plain low-fat yogurt. Low-sodium or reduced-sodium cheeses. °Fats and Oils °Tub margarines without trans fats. Light or reduced-fat mayonnaise and salad dressings (reduced sodium). Avocado. Safflower, olive, or canola oils. Natural peanut or almond butter. °Other °Unsalted popcorn and pretzels. °The items listed above may not be a complete list of recommended foods or beverages. Contact your dietitian for more options. °WHAT FOODS ARE NOT RECOMMENDED? °Grains °White bread. White pasta. White rice. Refined cornbread. Bagels and croissants. Crackers that contain trans fat. °Vegetables °Creamed or fried vegetables. Vegetables in a cheese sauce. Regular canned vegetables. Regular canned tomato sauce and paste. Regular tomato and vegetable juices. °Fruits °Dried fruits. Canned fruit in light or heavy syrup. Fruit juice. °Meat and Other Protein Products °Fatty cuts of meat. Ribs, chicken wings, bacon, sausage, bologna, salami, chitterlings, fatback, hot dogs, bratwurst, and packaged luncheon meats. Salted nuts and seeds. Canned beans with salt. °Dairy °Whole or 2% milk, cream, half-and-half, and cream cheese. Whole-fat or sweetened yogurt. Full-fat   cheeses or blue cheese. Nondairy creamers and whipped toppings. Processed cheese, cheese spreads, or cheese  curds. °Condiments °Onion and garlic salt, seasoned salt, table salt, and sea salt. Canned and packaged gravies. Worcestershire sauce. Tartar sauce. Barbecue sauce. Teriyaki sauce. Soy sauce, including reduced sodium. Steak sauce. Fish sauce. Oyster sauce. Cocktail sauce. Horseradish. Ketchup and mustard. Meat flavorings and tenderizers. Bouillon cubes. Hot sauce. Tabasco sauce. Marinades. Taco seasonings. Relishes. °Fats and Oils °Butter, stick margarine, lard, shortening, ghee, and bacon fat. Coconut, palm kernel, or palm oils. Regular salad dressings. °Other °Pickles and olives. Salted popcorn and pretzels. °The items listed above may not be a complete list of foods and beverages to avoid. Contact your dietitian for more information. °WHERE CAN I FIND MORE INFORMATION? °National [employer], [employer], [employer] Institute: www.nhlbi.nih.gov/health/health-topics/topics/dash/ °Document Released: 09/27/2011 Document Revised: 02/22/2014 Document Reviewed: 08/12/2013 °[employer]® Patient Information ©2015 [employer]. This information is not intended to replace advice given to you by your health care provider. Make sure you discuss any questions you have with your health care provider. ° °

## 2015-07-22 LAB — CBC WITH DIFFERENTIAL/PLATELET
BASOS ABS: 0 10*3/uL (ref 0.0–0.2)
BASOS: 1 %
EOS (ABSOLUTE): 0.1 10*3/uL (ref 0.0–0.4)
Eos: 1 %
HEMOGLOBIN: 15.5 g/dL (ref 12.6–17.7)
Hematocrit: 46.6 % (ref 37.5–51.0)
IMMATURE GRANS (ABS): 0 10*3/uL (ref 0.0–0.1)
IMMATURE GRANULOCYTES: 0 %
LYMPHS: 29 %
Lymphocytes Absolute: 2.4 10*3/uL (ref 0.7–3.1)
MCH: 29.5 pg (ref 26.6–33.0)
MCHC: 33.3 g/dL (ref 31.5–35.7)
MCV: 89 fL (ref 79–97)
MONOCYTES: 7 %
Monocytes Absolute: 0.6 10*3/uL (ref 0.1–0.9)
NEUTROS ABS: 5.1 10*3/uL (ref 1.4–7.0)
NEUTROS PCT: 62 %
PLATELETS: 246 10*3/uL (ref 150–379)
RBC: 5.25 x10E6/uL (ref 4.14–5.80)
RDW: 14.3 % (ref 12.3–15.4)
WBC: 8.2 10*3/uL (ref 3.4–10.8)

## 2015-07-22 LAB — BASIC METABOLIC PANEL
BUN / CREAT RATIO: 8 — AB (ref 9–20)
BUN: 9 mg/dL (ref 6–24)
CALCIUM: 9.7 mg/dL (ref 8.7–10.2)
CHLORIDE: 95 mmol/L — AB (ref 97–108)
CO2: 26 mmol/L (ref 18–29)
CREATININE: 1.07 mg/dL (ref 0.76–1.27)
GFR calc non Af Amer: 79 mL/min/{1.73_m2} (ref >59)
GFR, EST AFRICAN AMERICAN: 92 mL/min/{1.73_m2} (ref >59)
Glucose: 88 mg/dL (ref 65–99)
Potassium: 4.6 mmol/L (ref 3.5–5.2)
Sodium: 142 mmol/L (ref 134–144)

## 2015-07-22 LAB — HEPATITIS C ANTIBODY: Hep C Virus Ab: 0.1 s/co ratio (ref 0.0–0.9)

## 2015-07-25 NOTE — Progress Notes (Signed)
Left voice mail

## 2015-08-05 ENCOUNTER — Ambulatory Visit (INDEPENDENT_AMBULATORY_CARE_PROVIDER_SITE_OTHER): Payer: 59 | Admitting: Family Medicine

## 2015-08-05 ENCOUNTER — Encounter: Payer: Self-pay | Admitting: Family Medicine

## 2015-08-05 ENCOUNTER — Ambulatory Visit
Admission: RE | Admit: 2015-08-05 | Discharge: 2015-08-05 | Disposition: A | Discharge status: Home / Self Care | Payer: 59 | Source: Ambulatory Visit | Attending: Family Medicine | Admitting: Family Medicine

## 2015-08-05 VITALS — BP 128/82 | HR 95 | Temp 98.3°F | Resp 16 | Wt 259.0 lb

## 2015-08-05 DIAGNOSIS — E049 Nontoxic goiter, unspecified: Secondary | ICD-10-CM | POA: Insufficient documentation

## 2015-08-05 DIAGNOSIS — I452 Bifascicular block: Secondary | ICD-10-CM | POA: Diagnosis not present

## 2015-08-05 DIAGNOSIS — I10 Essential (primary) hypertension: Secondary | ICD-10-CM

## 2015-08-05 DIAGNOSIS — E040 Nontoxic diffuse goiter: Secondary | ICD-10-CM

## 2015-08-05 DIAGNOSIS — G4700 Insomnia, unspecified: Secondary | ICD-10-CM | POA: Diagnosis not present

## 2015-08-05 DIAGNOSIS — E034 Atrophy of thyroid (acquired): Secondary | ICD-10-CM | POA: Diagnosis not present

## 2015-08-05 DIAGNOSIS — E010 Iodine-deficiency related diffuse (endemic) goiter: Secondary | ICD-10-CM

## 2015-08-05 MED ORDER — AMLODIPINE BESY-BENAZEPRIL HCL 5-20 MG PO CAPS: 1.0000 | ORAL_CAPSULE | Freq: Every day | ORAL | Status: DC

## 2015-08-05 MED ORDER — TRAZODONE HCL 50 MG PO TABS: 50.0000 mg | ORAL_TABLET | Freq: Every evening | ORAL | Status: DC | PRN

## 2015-08-05 NOTE — Progress Notes (Signed)
Name: Kevin Marshall   MRN: [id number]    DOB: 24-Aug-1961   Date:08/05/2015       Progress Note  Subjective  Chief Complaint  Chief Complaint  Patient presents with  . Hypertension    patient is here for a 2 week follow-up    HPI  Patient is here for routine follow up of Hypertension, Insomnia, Goiter, Anxiety. First diagnosed with hypertension several years ago. Current anti-hypertension medication regimen includes dietary modification, weight management and amlodipine-benazepril 5-20 mg one a day. Patient is following physician recommended management. Checking blood pressure outside of physician office. Results average systolic 130-140 and average diastolic 75-85. Associated symptoms do not include headache, dizziness, nausea, lower extremity swelling, shortness of breath, chest pain, numbness.  An EKG was done at his last visit which showed RBBB. No cardiac symptoms reported. He is doing much better with sleep and anxious nature since starting Trazodone 50 mg at bedtime but notes some groggy feeling in the am which wears off as the day goes by. He has his thyroid US scheduled for today at 4pm due to goiter found on exam.   Past Medical History  Diagnosis Date  . Allergy     history of as a child, did take shots  . Blood transfusion without reported diagnosis   . GERD (gastroesophageal reflux disease)   . Sleep apnea   . Hypertension     borderline history of  . Diverticulitis 2013    patient was hospitalized for over a week for this    Past Surgical History  Procedure Laterality Date  . Achilles tendon surgery    . Hernia repair      3    Family History  Problem Relation Age of Onset  . Diabetes Mother   . Hypertension Father     Social History   Social History  . Marital Status: Married    Spouse Name: N/A  . Number of Children: 4  . Years of Education: N/A   Occupational History  . 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 Smoking  status: Never Smoker   . Smokeless tobacco: Never Used  . Alcohol Use: 1.2 oz/week    0 Standard drinks or equivalent, 2 Glasses of wine per week     Comment: wine nightly  . Drug Use: No  . Sexual Activity:    Partners: Female    Copy: None   Other Topics Concern  . Not on file   Social History Narrative     Current outpatient prescriptions:  .  amLODipine-benazepril (LOTREL) 5-20 MG capsule, Take 1 capsule by mouth daily. In place of lisinopril for BP, Disp: 30 capsule, Rfl: 0 .  buPROPion (WELLBUTRIN XL) 150 MG 24 hr tablet, Take 1 tablet (150 mg total) by mouth daily., Disp: 30 tablet, Rfl: 3 .  clotrimazole-betamethasone (LOTRISONE) lotion, Apply topically 2 (two) times daily., Disp: 30 mL, Rfl: 0 .  omeprazole (PRILOSEC) 40 MG capsule, Take 1 capsule (40 mg total) by mouth daily., Disp: 30 capsule, Rfl: 3 .  terbinafine (LAMISIL) 250 MG tablet, Take 1 tablet (250 mg total) by mouth daily., Disp: 90 tablet, Rfl: 0 .  traZODone (DESYREL) 50 MG tablet, Take 1 tablet (50 mg total) by mouth at bedtime. Start at half pill for three night and titrate up to  qhs, Disp: 45 tablet, Rfl: 0  No Known Allergies   ROS  CONSTITUTIONAL: No significant weight changes, fever, chills, weakness or fatigue.  HEENT:  - Eyes: No visual changes.  - Ears: No auditory changes. No pain.  - Nose: No sneezing, congestion, runny nose. - Throat: No sore throat. No changes in swallowing. SKIN: No rash or itching.  CARDIOVASCULAR: No chest pain, chest pressure or chest discomfort. No palpitations or edema.  RESPIRATORY: No shortness of breath, cough or sputum.  NEUROLOGICAL: No headache, dizziness, syncope, paralysis, ataxia, numbness or tingling in the extremities. No memory changes. No change in bowel or bladder control.  MUSCULOSKELETAL: No joint pain. No muscle pain. ENDOCRINOLOGIC: No reports of sweating, cold or heat intolerance. No polyuria or polydipsia.    Objective  Filed Vitals:   08/05/15 1413  BP: 128/82  Pulse: 95  Temp: 98.3 F (36.8 C)  TempSrc: Oral  Resp: 16  Weight: 259 lb (117.482 kg)  SpO2: 97%   Body mass index is 41.82 kg/(m^2).  Physical Exam  Constitutional: Patient is obese and well-nourished. In no distress.  HEENT:  - Head: Normocephalic and atraumatic.  - Ears: Bilateral TMs gray, no erythema or effusion - Nose: Nasal mucosa moist - Mouth/Throat: Oropharynx is clear and moist. No tonsillar hypertrophy or erythema. No post nasal drainage.  - Eyes: Conjunctivae clear, EOM movements normal. PERRLA. No scleral icterus.  Neck: Normal range of motion. Neck supple. No JVD present. Yes diffuse thyromegaly present.  Cardiovascular: Normal rate, regular rhythm and normal heart sounds.  No murmur heard.  Pulmonary/Chest: Effort normal and breath sounds normal. No respiratory distress. Musculoskeletal: Normal range of motion bilateral UE and LE, no joint effusions. Peripheral vascular: Bilateral LE no edema. Psychiatric: Patient has a normal mood and affect. Behavior is normal in office today. Judgment and thought content normal in office today.   Assessment & Plan  1. Hypertension goal BP (blood pressure) < 140/90 Improved.  - amLODipine-benazepril (LOTREL) 5-20 MG capsule; Take 1 capsule by mouth daily.  Dispense: 30 capsule; Refill: 2  2. RBBB (right bundle branch block with left anterior fascicular block) Refer to cardiology near future.  3. Insomnia Recommend using med on PRN basis.  - traZODone (DESYREL) 50 MG tablet; Take 1 tablet (50 mg total) by mouth at bedtime as needed for sleep. Start at half pill for three night and titrate up to 100mg  qhs  Dispense: 30 tablet; Refill: 2  4. Goiter diffuse Pending ultrasound results.

## 2015-08-08 ENCOUNTER — Ambulatory Visit: Payer: 59

## 2015-08-11 ENCOUNTER — Other Ambulatory Visit: Payer: Self-pay

## 2015-08-11 DIAGNOSIS — I10 Essential (primary) hypertension: Secondary | ICD-10-CM

## 2015-08-11 DIAGNOSIS — F411 Generalized anxiety disorder: Secondary | ICD-10-CM

## 2015-08-11 DIAGNOSIS — K219 Gastro-esophageal reflux disease without esophagitis: Secondary | ICD-10-CM

## 2015-08-11 NOTE — Telephone Encounter (Signed)
Got a fax from [facility] requesting a refill of this patient's 2 medications (Bupropn HCL) & (Omeprazole).  Refill request was sent to Dr. Edwena FeltyAshany Sundaram for approval and submission.

## 2015-08-12 MED ORDER — OMEPRAZOLE 40 MG PO CPDR: 40.0000 mg | DELAYED_RELEASE_CAPSULE | Freq: Every day | ORAL | Status: DC

## 2015-08-12 MED ORDER — AMLODIPINE BESY-BENAZEPRIL HCL 5-20 MG PO CAPS: 1.0000 | ORAL_CAPSULE | Freq: Every day | ORAL | Status: DC

## 2015-08-12 MED ORDER — BUPROPION HCL ER (XL) 150 MG PO TB24: 150.0000 mg | ORAL_TABLET | Freq: Every day | ORAL | Status: DC

## 2015-10-10 ENCOUNTER — Other Ambulatory Visit: Payer: Self-pay

## 2015-10-10 DIAGNOSIS — I10 Essential (primary) hypertension: Secondary | ICD-10-CM

## 2015-10-10 DIAGNOSIS — F411 Generalized anxiety disorder: Secondary | ICD-10-CM

## 2015-10-10 DIAGNOSIS — K219 Gastro-esophageal reflux disease without esophagitis: Secondary | ICD-10-CM

## 2015-10-11 ENCOUNTER — Encounter: Payer: Self-pay | Admitting: Family Medicine

## 2015-10-11 ENCOUNTER — Ambulatory Visit (INDEPENDENT_AMBULATORY_CARE_PROVIDER_SITE_OTHER): Payer: 59 | Admitting: Family Medicine

## 2015-10-11 VITALS — BP 126/80 | HR 94 | Temp 98.8°F | Resp 16 | Wt 259.2 lb

## 2015-10-11 DIAGNOSIS — K219 Gastro-esophageal reflux disease without esophagitis: Secondary | ICD-10-CM

## 2015-10-11 DIAGNOSIS — F411 Generalized anxiety disorder: Secondary | ICD-10-CM | POA: Diagnosis not present

## 2015-10-11 DIAGNOSIS — G4700 Insomnia, unspecified: Secondary | ICD-10-CM | POA: Diagnosis not present

## 2015-10-11 DIAGNOSIS — I10 Essential (primary) hypertension: Secondary | ICD-10-CM

## 2015-10-11 MED ORDER — TRAZODONE HCL 100 MG PO TABS: 100.0000 mg | ORAL_TABLET | Freq: Every evening | ORAL | Status: DC | PRN

## 2015-10-11 MED ORDER — AMLODIPINE BESY-BENAZEPRIL HCL 5-20 MG PO CAPS: 1.0000 | ORAL_CAPSULE | Freq: Every day | ORAL | Status: DC

## 2015-10-11 MED ORDER — BUPROPION HCL ER (XL) 150 MG PO TB24: 150.0000 mg | ORAL_TABLET | Freq: Every day | ORAL | Status: DC

## 2015-10-11 MED ORDER — OMEPRAZOLE 40 MG PO CPDR: 40.0000 mg | DELAYED_RELEASE_CAPSULE | Freq: Every day | ORAL | Status: DC

## 2015-10-11 NOTE — Progress Notes (Signed)
Name: Kevin Marshall   MRN: [id number]    DOB: [birth year]   Date:10/11/2015       Progress Note  Subjective  Chief Complaint  Chief Complaint  Patient presents with  . Hypertension    patient is here for his 24-month follow-up  . Obesity    patient is here for his 37-month follow-up  . Medication Refill    trazadone. patient thinks the dosage should be increased.    HPI  Kevin Marshall was first diagnosed with hypertension several years ago. Current anti-hypertension medication regimen includes dietary modification, weight management and amlodipine-benazepril 5-20 mg one a day. Patient is following physician recommended management. Checking blood pressure outside of physician office. Results average systolic 130-140 and average diastolic 75-85. Associated symptoms do not include headache, dizziness, nausea, lower extremity swelling, shortness of breath, chest pain, numbness. An EKG was done at a previous visit in 2016 which showed RBBB. No cardiac symptoms reported. He is doing much better with sleep and anxious nature since starting Trazodone 50 mg at bedtime. He would like to increase the dose as he is tolerating it better now. Needs refills.    Past Medical History  Diagnosis Date  . Allergy     history of as a child, did take shots  . Blood transfusion without reported diagnosis   . GERD (gastroesophageal reflux disease)   . Sleep apnea   . Hypertension     borderline history of  . Diverticulitis 2013    patient was hospitalized for over a week for this    Patient Active Problem List   Diagnosis Date Noted  . Goiter diffuse 08/05/2015  . Insomnia 07/21/2015  . 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 Annual physical exam 07/12/2015  . History of hernia repair 07/12/2015  . Onychomycosis of toenail 07/01/2015  . Eczematous dermatitis 07/01/2015  . Hypertension goal BP (blood pressure) < 140/90 06/08/2015  . GERD without  esophagitis 06/08/2015  . GAD (generalized anxiety disorder) 06/08/2015  . Obstructive sleep apnea of adult 06/08/2015  . History of GI diverticular bleed 06/08/2015  . Obesity, Class III, BMI 40-49.9 (morbid obesity) (HCC) 06/08/2015  . Pre-diabetes 06/08/2015  . Elevated LDL cholesterol level 06/08/2015  . Prostate cancer screening 06/08/2015    Social History  Substance Use Topics  . Smoking status: Never Smoker   . Smokeless tobacco: Never Used  . Alcohol Use: 1.2 oz/week    0 Standard drinks or equivalent, 2 Glasses of wine per week     Comment: wine nightly     Current outpatient prescriptions:  .  amLODipine-benazepril (LOTREL) 5-20 MG capsule, Take 1 capsule by mouth daily., Disp: 90 capsule, Rfl: 0 .  buPROPion (WELLBUTRIN XL) 150 MG 24 hr tablet, Take 1 tablet (150 mg total) by mouth daily., Disp: 90 tablet, Rfl: 0 .  clotrimazole-betamethasone (LOTRISONE) lotion, Apply topically 2 (two) times daily., Disp: 30 mL, Rfl: 0 .  omeprazole (PRILOSEC) 40 MG capsule, Take 1 capsule (40 mg total) by mouth daily., Disp: 90 capsule, Rfl: 0 .  terbinafine (LAMISIL) 250 MG tablet, Take 1 tablet (250 mg total) by mouth daily., Disp: 90 tablet, Rfl: 0 .  traZODone (DESYREL) 50 MG tablet, Take 1 tablet (50 mg total) by mouth at bedtime as needed for sleep. Start at half pill for three night and titrate up to  qhs, Disp: 30 tablet, Rfl: 2  Past Surgical History  Procedure Laterality Date  . Achilles tendon surgery    .  Hernia repair      3    Family History  Problem Relation Age of Onset  . Diabetes Mother   . Hypertension Father     No Known Allergies   Review of Systems  CONSTITUTIONAL: No significant weight changes, fever, chills, weakness or fatigue.  CARDIOVASCULAR: No chest pain, chest pressure or chest discomfort. No palpitations or edema.  RESPIRATORY: No shortness of breath, cough or sputum.  NEUROLOGICAL: No headache, dizziness, syncope, paralysis, ataxia,  numbness or tingling in the extremities. No memory changes. No change in bowel or bladder control.  MUSCULOSKELETAL: No joint pain. No muscle pain. PSYCHIATRIC: No change in mood. No change in sleep pattern.  ENDOCRINOLOGIC: No reports of sweating, cold or heat intolerance. No polyuria or polydipsia.     Objective  BP 126/80 mmHg  Pulse 94  Temp(Src) 98.8 F (37.1 C) (Oral)  Resp 16  Wt 259 lb 3.2 oz (117.572 kg)  SpO2 96% Body mass index is 41.86 kg/(m^2).  Physical Exam  Constitutional: Patient is obese and well-nourished. In no distress.  Cardiovascular: Normal rate, regular rhythm and normal heart sounds.  No murmur heard.  Pulmonary/Chest: Effort normal and breath sounds normal. No respiratory distress. Musculoskeletal: Normal range of motion bilateral UE and LE, no joint effusions. Neurological: CN II-XII grossly intact with no focal deficits. Alert and oriented to person, place, and time. Coordination, balance, strength, speech and gait are normal.  Skin: Skin is warm and dry. No rash noted. No erythema.  Psychiatric: Patient has a normal mood and affect. Behavior is normal in office today. Judgment and thought content normal in office today.   Recent Results (from the past 2160 hour(s))  Basic Metabolic Panel (BMET)     Status: Abnormal   Collection Time: 07/21/15 11:12 AM  Result Value Ref Range   Glucose 88 65 - 99 mg/dL    Comment: Specimen received hemolyzed. Clinical correlation indicated.   BUN 9 6 - 24 mg/dL   Creatinine, Ser 1.611.07 0.76 - 1.27 mg/dL   GFR calc non Af Amer 79 >59 mL/min/1.73   GFR calc Af Amer 92 >59 mL/min/1.73   BUN/Creatinine Ratio 8 (L) 9 - 20   Sodium 142 134 - 144 mmol/L   Potassium 4.6 3.5 - 5.2 mmol/L   Chloride 95 (L) 97 - 108 mmol/L   CO2 26 18 - 29 mmol/L   Calcium 9.7 8.7 - 10.2 mg/dL  Hepatitis C antibody     Status: None   Collection Time: 07/21/15 11:12 AM  Result Value Ref Range   Hep C Virus Ab 0.1 0.0 - 0.9 s/co ratio     Comment:                                   Negative:     < 0.8                              Indeterminate: 0.8 - 0.9                                   Positive:     > 0.9  The CDC recommends that a positive HCV antibody result  be followed up with a HCV Nucleic Acid Amplification  test ([id number]).   CBC with Differential/Platelet  Status: None   Collection Time: 07/21/15 11:12 AM  Result Value Ref Range   WBC 8.2 3.4 - 10.8 x10E3/uL   RBC 5.25 4.14 - 5.80 x10E6/uL   Hemoglobin 15.5 12.6 - 17.7 g/dL   Hematocrit 32.4 40.1 - 51.0 %   MCV 89 79 - 97 fL   MCH 29.5 26.6 - 33.0 pg   MCHC 33.3 31.5 - 35.7 g/dL   RDW 02.7 25.3 - 66.4 %   Platelets 246 150 - 379 x10E3/uL   Neutrophils 62 %   Lymphs 29 %   Monocytes 7 %   Eos 1 %   Basos 1 %   Neutrophils Absolute 5.1 1.4 - 7.0 x10E3/uL   Lymphocytes Absolute 2.4 0.7 - 3.1 x10E3/uL   Monocytes Absolute 0.6 0.1 - 0.9 x10E3/uL   EOS (ABSOLUTE) 0.1 0.0 - 0.4 x10E3/uL   Basophils Absolute 0.0 0.0 - 0.2 x10E3/uL   Immature Granulocytes 0 %   Immature Grans (Abs) 0.0 0.0 - 0.1 x10E3/uL     Assessment & Plan  1. Hypertension goal BP (blood pressure) < 140/90 Stable, refilled.  - amLODipine-benazepril (LOTREL) 5-20 MG capsule; Take 1 capsule by mouth daily.  Dispense: 90 capsule; Refill: 2  2. Obesity, Class III, BMI 40-49.9 (morbid obesity) (HCC) Has not gained weight but is maintaining current weight.   The patient has been counseled on their higher than normal BMI.  They have verbally expressed understanding their increased risk for other diseases.  In efforts to meet a better target BMI goal the patient has been counseled on lifestyle, diet and exercise modification tactics. Start with moderate intensity aerobic exercise (walking, jogging, elliptical, swimming, group or individual sports, hiking) at least a day at least 4 days a week and increase intensity, duration, frequency as tolerated. Diet should include well balance fresh  fruits and vegetables avoiding processed foods, carbohydrates and sugars. Drink at least 8oz 10 glasses a day avoiding sodas, sugary fruit drinks, sweetened tea. Check weight on a reliable scale daily and monitor weight loss progress daily. Consider investing in mobile phone apps that will help keep track of weight loss goals.  3. Insomnia Increased Trazodone dose from 50 mg to 100 mg.   - traZODone (DESYREL) 100 MG tablet; Take 1 tablet (100 mg total) by mouth at bedtime as needed for sleep.  Dispense: 90 tablet; Refill: 2  4. GAD (generalized anxiety disorder) Doing well.  - buPROPion (WELLBUTRIN XL) 150 MG 24 hr tablet; Take 1 tablet (150 mg total) by mouth daily.  Dispense: 90 tablet; Refill: 2  5. GERD without esophagitis Refilled.   - omeprazole (PRILOSEC) 40 MG capsule; Take 1 capsule (40 mg total) by mouth daily.  Dispense: 90 capsule; Refill: 2

## 2015-10-12 ENCOUNTER — Other Ambulatory Visit: Payer: Self-pay

## 2015-10-12 DIAGNOSIS — I10 Essential (primary) hypertension: Secondary | ICD-10-CM

## 2015-10-12 MED ORDER — AMLODIPINE BESY-BENAZEPRIL HCL 5-20 MG PO CAPS: 1.0000 | ORAL_CAPSULE | Freq: Every day | ORAL | Status: DC

## 2015-11-29 ENCOUNTER — Other Ambulatory Visit: Payer: Self-pay | Admitting: Family Medicine

## 2015-12-06 ENCOUNTER — Ambulatory Visit: Payer: 59 | Admitting: Family Medicine

## 2016-02-13 ENCOUNTER — Ambulatory Visit (INDEPENDENT_AMBULATORY_CARE_PROVIDER_SITE_OTHER): Payer: 59 | Admitting: Family Medicine

## 2016-02-13 ENCOUNTER — Encounter: Payer: Self-pay | Admitting: Family Medicine

## 2016-02-13 VITALS — BP 142/94 | HR 89 | Temp 97.9°F | Resp 14 | Wt 268.0 lb

## 2016-02-13 DIAGNOSIS — I10 Essential (primary) hypertension: Secondary | ICD-10-CM

## 2016-02-13 DIAGNOSIS — R7303 Prediabetes: Secondary | ICD-10-CM

## 2016-02-13 DIAGNOSIS — R197 Diarrhea, unspecified: Secondary | ICD-10-CM

## 2016-02-13 MED ORDER — ASPIRIN EC 81 MG PO TBEC: 81.0000 mg | DELAYED_RELEASE_TABLET | Freq: Every day | ORAL | Status: AC

## 2016-02-13 NOTE — Patient Instructions (Addendum)
Your goal blood pressure is less than 140 mmHg on top. Try to follow the DASH guidelines (DASH stands for Dietary Approaches to Stop Hypertension) Try to limit the sodium in your diet.  Ideally, consume less than 1.5 grams (less than 1,500mg ) per day. Do not add salt when cooking or at the table.  Check the sodium amount on labels when shopping, and choose items lower in sodium when given a choice. Avoid or limit foods that already contain a lot of sodium. Eat a diet rich in fruits and vegetables and whole grains. Avoid salt substitutes (avoid anything with potassium chloride) Really try to cut down on salt  DASH Eating Plan DASH stands for "Dietary Approaches to Stop Hypertension." The DASH eating plan is a healthy eating plan that has been shown to reduce high blood pressure (hypertension). Additional health benefits may include reducing the risk of type 2 diabetes mellitus, heart disease, and stroke. The DASH eating plan may also help with weight loss. WHAT DO I NEED TO KNOW ABOUT THE DASH EATING PLAN? For the DASH eating plan, you will follow these general guidelines:  Choose foods with a percent daily value for sodium of less than 5% (as listed on the food label).  Use salt-free seasonings or herbs instead of table salt or sea salt.  Check with your health care provider or pharmacist before using salt substitutes.  Eat lower-sodium products, often labeled as "lower sodium" or "no salt added."  Eat fresh foods.  Eat more vegetables, fruits, and low-fat dairy products.  Choose whole grains. Look for the word "whole" as the first word in the ingredient list.  Choose fish and skinless chicken or [place] more often than red meat. Limit fish, poultry, and meat to 6 oz (170 g) each day.  Limit sweets, desserts, sugars, and sugary drinks.  Choose heart-healthy fats.  Limit cheese to 1 oz (28 g) per day.  Eat more home-cooked food and less restaurant, buffet, and fast food.  Limit  fried foods.  Cook foods using methods other than frying.  Limit canned vegetables. If you do use them, rinse them well to decrease the sodium.  When eating at a restaurant, ask that your food be prepared with less salt, or no salt if possible. WHAT FOODS CAN I EAT? Seek help from a dietitian for individual calorie needs. Grains Whole grain or whole wheat bread. Brown rice. Whole grain or whole wheat pasta. Quinoa, bulgur, and whole grain cereals. Low-sodium cereals. Corn or whole wheat flour tortillas. Whole grain cornbread. Whole grain crackers. Low-sodium crackers. Vegetables Fresh or frozen vegetables (raw, steamed, roasted, or grilled). Low-sodium or reduced-sodium tomato and vegetable juices. Low-sodium or reduced-sodium tomato sauce and paste. Low-sodium or reduced-sodium canned vegetables.  Fruits All fresh, canned (in natural juice), or frozen fruits. Meat and Other Protein Products Ground beef (85% or leaner), grass-fed beef, or beef trimmed of fat. Skinless chicken or [place]. Ground chicken or [place]. Pork trimmed of fat. All fish and seafood. Eggs. Dried beans, peas, or lentils. Unsalted nuts and seeds. Unsalted canned beans. Dairy Low-fat dairy products, such as skim or 1% milk, 2% or reduced-fat cheeses, low-fat ricotta or cottage cheese, or plain low-fat yogurt. Low-sodium or reduced-sodium cheeses. Fats and Oils Tub margarines without trans fats. Light or reduced-fat mayonnaise and salad dressings (reduced sodium). Avocado. Safflower, olive, or canola oils. Natural peanut or almond butter. Other Unsalted popcorn and pretzels. The items listed above may not be a complete list of recommended foods or beverages.  Contact your dietitian for more options. WHAT FOODS ARE NOT RECOMMENDED? Grains White bread. White pasta. White rice. Refined cornbread. Bagels and croissants. Crackers that contain trans fat. Vegetables Creamed or fried vegetables. Vegetables in a cheese sauce.  Regular canned vegetables. Regular canned tomato sauce and paste. Regular tomato and vegetable juices. Fruits Dried fruits. Canned fruit in light or heavy syrup. Fruit juice. Meat and Other Protein Products Fatty cuts of meat. Ribs, chicken wings, bacon, sausage, bologna, salami, chitterlings, fatback, hot dogs, bratwurst, and packaged luncheon meats. Salted nuts and seeds. Canned beans with salt. Dairy Whole or 2% milk, cream, half-and-half, and cream cheese. Whole-fat or sweetened yogurt. Full-fat cheeses or blue cheese. Nondairy creamers and whipped toppings. Processed cheese, cheese spreads, or cheese curds. Condiments Onion and garlic salt, seasoned salt, table salt, and sea salt. Canned and packaged gravies. Worcestershire sauce. Tartar sauce. Barbecue sauce. Teriyaki sauce. Soy sauce, including reduced sodium. Steak sauce. Fish sauce. Oyster sauce. Cocktail sauce. Horseradish. Ketchup and mustard. Meat flavorings and tenderizers. Bouillon cubes. Hot sauce. Tabasco sauce. Marinades. Taco seasonings. Relishes. Fats and Oils Butter, stick margarine, lard, shortening, ghee, and bacon fat. Coconut, palm kernel, or palm oils. Regular salad dressings. Other Pickles and olives. Salted popcorn and pretzels. The items listed above may not be a complete list of foods and beverages to avoid. Contact your dietitian for more information. WHERE CAN I FIND MORE INFORMATION? National Heart, Lung, and Blood Institute: [employer]   This information is not intended to replace advice given to you by your health care provider. Make sure you discuss any questions you have with your health care provider.   Document Released: 09/27/2011 Document Revised: 10/29/2014 Document Reviewed: 08/12/2013 Elsevier Interactive Patient Education [employer]

## 2016-02-13 NOTE — Progress Notes (Signed)
BP 142/94 mmHg  Pulse 89  Temp(Src) 97.9 F (36.6 C) (Oral)  Resp 14  Wt 268 lb (121.564 kg)  SpO2 97%   Subjective:    Patient ID: Kevin Marshall, male    DOB: 22-Nov-1960, 55 y.o.   MRN: [id number]  HPI: Kevin Marshall is a 55 y.o. male  Chief Complaint  Patient presents with  . Diarrhea    onset 2 weeks has sinced improved.  Staes it would happen first thing in am, and would feel weak   Patient is new to me, as his previous provider left this practice Diarrhea off and one for two weeks; 2-3 days in a row; very odd; no diarrhea for about a week now; thought maybe a stomach bug, but maybe a food allergy; happening in the mornings; would wake him in the morning, run to the commode; by midday, resolved 2-3 x a day until empty; no blood in the stool He had a really bad bout a few years ago, was in the ICU and saw blood; thought maybe diverticulitis; had colonoscopies, had the pill endoscopy, went back to work and then was bleeding again, then went to work one day, almost passed out, Hgb was so low; thought he was having heart attack, got blood transfusions, in the ICU He has not seen any blood; no mucous The last bout he felt kind of weak, and that prompted him to make appt; no palpitations; no cramps in the calves Appetite has been "not bad"; did not eat as much as usual out of fear (would have to come through); appetite has returned No fevers No travel Regular city water No one at home having similar sx No animals at home are sick  No raw seafood No antibiotics Took imodium the last bout and it slowed things down  High blood pressure; not very accurate cuff at home; checks periodically; taking regularly; reviewed previous BP readings with him; not as much fast food now as before  Prediabetes listed in problems; last glucose from September was 88, but A1c in August 2016 was 5.7   Obesity; he has gained some weight over the last 4-5 months  Depression screen [facility] 2/9  02/13/2016 10/11/2015 07/12/2015 06/08/2015  Decreased Interest 0 0 0 0  Down, Depressed, Hopeless 0 0 0 0  PHQ - 2 Score 0 0 0 0   Relevant past medical, surgical, family and social history reviewed and updated as indicated. Interim medical history since our last visit reviewed. Allergies and medications reviewed and updated.  Review of Systems Per HPI unless specifically indicated above     Objective:    BP 142/94 mmHg  Pulse 89  Temp(Src) 97.9 F (36.6 C) (Oral)  Resp 14  Wt 268 lb (121.564 kg)  SpO2 97%  Wt Readings from Last 3 Encounters:  02/13/16 268 lb (121.564 kg)  10/11/15 259 lb 3.2 oz (117.572 kg)  08/05/15 259 lb (117.482 kg)   body mass index is 43.28 kg/(m^2).  Physical Exam  Constitutional: He appears well-developed and well-nourished. No distress.  Morbidly obese  HENT:  Head: Normocephalic and atraumatic.  Eyes: EOM are normal. No scleral icterus.  Neck: No thyromegaly present.  Cardiovascular: Normal rate and regular rhythm.   Pulmonary/Chest: Effort normal and breath sounds normal.  Abdominal: Soft. Bowel sounds are normal. He exhibits no distension and no mass. There is no tenderness. There is no guarding.  Musculoskeletal: He exhibits no edema.  Neurological: He is alert.  Skin: Skin is warm  and dry. No pallor.  Psychiatric: He has a normal mood and affect. His behavior is normal. Judgment and thought content normal.      Assessment & Plan:   Problem List Items Addressed This Visit      Cardiovascular and Mediastinum   Hypertension goal BP (blood pressure) < 140/90    BP not to goal; discussed home monitoring with cuff; DASH guidelines; weight loss; return for f/u in 3 weeks; start aspirin for cardioprotection      Relevant Medications   aspirin EC 81 MG tablet     Other   Obesity, Class III, BMI 40-49.9 (morbid obesity) (HCC)    Encouraged wt loss; praised him for not eating as much fast food      Pre-diabetes    A1c was 5.7 in August;  return for discussion, f/u in a few weeks; will try to help him work on weight loss, which will be key for him to not develop diabetes in the future       Other Visit Diagnoses    Diarrhea, unspecified type    -  Primary    will check labs to r/o celiac disease, food allergy; further testing as indicated; close f/u    Relevant Orders    Gliadin antibodies, serum (Completed)    Tissue transglutaminase, IgA (Completed)    Reticulin Antibody, IgA w reflex titer (Completed)    Food Allergy Profile (Completed)    CBC with Differential/Platelet (Completed)    Basic metabolic panel (Completed)       Follow up plan: Return in about 3 weeks (around 03/05/2016) for blood pressure.  An after-visit summary was printed and given to the patient at check-out.  Please see the patient instructions which may contain other information and recommendations beyond what is mentioned above in the assessment and plan.  Meds ordered this encounter  Medications  . aspirin EC 81 MG tablet    Sig: Take 1 tablet (81 mg total) by mouth daily.    Dispense:  30 tablet    Refill:  11    Orders Placed This Encounter  Procedures  . Gliadin antibodies, serum  . Tissue transglutaminase, IgA  . Reticulin Antibody, IgA w reflex titer  . Food Allergy Profile  . CBC with Differential/Platelet  . Basic metabolic panel

## 2016-02-18 ENCOUNTER — Telehealth: Payer: Self-pay | Admitting: Family Medicine

## 2016-02-18 DIAGNOSIS — Z91018 Allergy to other foods: Secondary | ICD-10-CM

## 2016-02-18 NOTE — Telephone Encounter (Signed)
I called about labs Left msg, do not be alarmed I am calling on a Saturday; my chance to catch up I'll try him another day

## 2016-02-20 ENCOUNTER — Encounter: Payer: Self-pay | Admitting: Family Medicine

## 2016-02-20 LAB — BASIC METABOLIC PANEL
BUN/Creatinine Ratio: 14 (ref 9–20)
BUN: 14 mg/dL (ref 6–24)
CO2: 25 mmol/L (ref 18–29)
Calcium: 9.5 mg/dL (ref 8.7–10.2)
Chloride: 97 mmol/L (ref 96–106)
Creatinine, Ser: 0.98 mg/dL (ref 0.76–1.27)
GFR calc Af Amer: 102 mL/min/{1.73_m2} (ref >59)
GFR calc non Af Amer: 88 mL/min/{1.73_m2} (ref >59)
GLUCOSE: 90 mg/dL (ref 65–99)
POTASSIUM: 3.7 mmol/L (ref 3.5–5.2)
SODIUM: 141 mmol/L (ref 134–144)

## 2016-02-20 LAB — CBC WITH DIFFERENTIAL/PLATELET
BASOS ABS: 0 10*3/uL (ref 0.0–0.2)
Basos: 0 %
EOS (ABSOLUTE): 0.1 10*3/uL (ref 0.0–0.4)
Eos: 2 %
HEMOGLOBIN: 14.1 g/dL (ref 12.6–17.7)
Hematocrit: 42.9 % (ref 37.5–51.0)
Immature Grans (Abs): 0 10*3/uL (ref 0.0–0.1)
Immature Granulocytes: 0 %
LYMPHS ABS: 1.9 10*3/uL (ref 0.7–3.1)
Lymphs: 29 %
MCH: 29 pg (ref 26.6–33.0)
MCHC: 32.9 g/dL (ref 31.5–35.7)
MCV: 88 fL (ref 79–97)
MONOCYTES: 8 %
Monocytes Absolute: 0.5 10*3/uL (ref 0.1–0.9)
Neutrophils Absolute: 4.2 10*3/uL (ref 1.4–7.0)
Neutrophils: 61 %
PLATELETS: 233 10*3/uL (ref 150–379)
RBC: 4.86 x10E6/uL (ref 4.14–5.80)
RDW: 14.2 % (ref 12.3–15.4)
WBC: 6.7 10*3/uL (ref 3.4–10.8)

## 2016-02-20 LAB — RETICULIN ANTIBODIES, IGA W TITER: Reticulin Ab, IgA: NEGATIVE titer (ref <2.5)

## 2016-02-20 LAB — FOOD ALLERGY PROFILE
ALLERGEN CORN, IGE: 0.24 kU/L — AB
Milk IgE: <0.1 kU/L
PEANUT IGE: 0.28 kU/L — AB
Scallop IgE: <0.1 kU/L
Sesame Seed IgE: 0.31 kU/L — AB
Shrimp IgE: <0.1 kU/L
Soybean IgE: 0.1 kU/L — AB
WHEAT IGE: 0.21 kU/L — AB
Walnut IgE: 0.16 kU/L — AB

## 2016-02-20 LAB — GLIADIN ANTIBODIES, SERUM
Antigliadin Abs, IgA: 6 units (ref 0–19)
Gliadin IgG: 2 units (ref 0–19)

## 2016-02-20 LAB — TISSUE TRANSGLUTAMINASE, IGA: Transglutaminase IgA: <2 U/mL (ref 0–3)

## 2016-02-21 ENCOUNTER — Encounter: Payer: Self-pay | Admitting: Family Medicine

## 2016-02-21 DIAGNOSIS — Z91018 Allergy to other foods: Secondary | ICD-10-CM

## 2016-02-21 HISTORY — DX: Allergy to other foods: Z91.018

## 2016-02-21 NOTE — Telephone Encounter (Signed)
I talked with patient right after lunch; explained equivocal/low food allergies to six different thing; not an exhaustive list, so he may benefit from further testing to see if his GI symptoms are related to food allergies No diarrhea for 3 weeks, so I doubt ova, parasites, infectious source He agrees to start with allergist Other labs were fine

## 2016-02-26 NOTE — Assessment & Plan Note (Signed)
Encouraged wt loss; praised him for not eating as much fast food

## 2016-02-26 NOTE — Assessment & Plan Note (Signed)
BP not to goal; discussed home monitoring with cuff; DASH guidelines; weight loss; return for f/u in 3 weeks; start aspirin for cardioprotection

## 2016-02-26 NOTE — Assessment & Plan Note (Signed)
A1c was 5.7 in August; return for discussion, f/u in a few weeks; will try to help him work on weight loss, which will be key for him to not develop diabetes in the future

## 2016-03-05 ENCOUNTER — Ambulatory Visit: Payer: 59 | Admitting: Family Medicine

## 2016-04-05 ENCOUNTER — Ambulatory Visit (INDEPENDENT_AMBULATORY_CARE_PROVIDER_SITE_OTHER): Payer: 59 | Admitting: Family Medicine

## 2016-04-05 ENCOUNTER — Encounter: Payer: Self-pay | Admitting: Family Medicine

## 2016-04-05 VITALS — BP 128/82 | HR 82 | Temp 98.6°F | Resp 16 | Ht 66.0 in | Wt 264.8 lb

## 2016-04-05 DIAGNOSIS — I10 Essential (primary) hypertension: Secondary | ICD-10-CM

## 2016-04-05 DIAGNOSIS — Z91018 Allergy to other foods: Secondary | ICD-10-CM | POA: Diagnosis not present

## 2016-04-05 MED ORDER — TRAMADOL HCL 50 MG PO TABS: 50.0000 mg | ORAL_TABLET | Freq: Four times a day (QID) | ORAL | Status: DC | PRN

## 2016-04-05 NOTE — Patient Instructions (Signed)
Your goal blood pressure is less than 140 mmHg on top. Try to follow the DASH guidelines (DASH stands for Dietary Approaches to Stop Hypertension) Try to limit the sodium in your diet.  Ideally, consume less than 1.5 grams (less than 1,500mg ) per day. Do not add salt when cooking or at the table.  Check the sodium amount on labels when shopping, and choose items lower in sodium when given a choice. Avoid or limit foods that already contain a lot of sodium. Eat a diet rich in fruits and vegetables and whole grains.  Check out the information at familydoctor.org entitled "Nutrition for Weight Loss: What You Need to Know about Fad Diets" Try to lose between 1-2 pounds per week by taking in fewer calories and burning off more calories You can succeed by limiting portions, limiting foods dense in calories and fat, becoming more active, and drinking 8 glasses of water a day (64 ounces) Don't skip meals, especially breakfast, as skipping meals may alter your metabolism Do not use over-the-counter weight loss pills or gimmicks that claim rapid weight loss A healthy BMI (or body mass index) is between 18.5 and 24.9 You can calculate your ideal BMI at the NIH website [employer]huhttp://www.nhlbi.nih.gov/health/educational/lose_wt/BMI/bmicalc.htm  Let's shoot for 26-52 pounds of weight loss over the coming year

## 2016-04-05 NOTE — Assessment & Plan Note (Signed)
So glad to see patient's pressure has improved; continue working on weight loss, DASH guidelines, sodium reduction, etc.

## 2016-04-05 NOTE — Progress Notes (Signed)
BP 128/82 mmHg  Pulse 82  Temp(Src) 98.6 F (37 C) (Oral)  Resp 16  Ht 5\' 6"  (1.676 m)  Wt 264 lb 12.8 oz (120.112 kg)  BMI 42.76 kg/m2  SpO2 98%   Subjective:    Patient ID: Kevin Marshall, male    DOB: 1961-05-29, 55 y.o.   MRN: [id number]  HPI: Kevin Marshall is a 55 y.o. male  Chief Complaint  Patient presents with  . Hypertension    recheck BP  . Allergy Testing    discuss results of allergy test   He went for allergy testing; he was lit up on his back He takes two pills and two nasal sprays now from the allergist They gave him recommendations for home environment They did recommend allergy shots Has had two nasal surgeries, deviated septum surgery, allergies as an adult; dealt with headaches which comes from sinuses He even took allergy shots as a child Since starting the allergy medicine, his headaches have subsided quite a bit; not 100% gone, but subsided quite a bit; takes ibuprofen even now and then to curb the pain a little; took one last night He was doing yard work, wearing mask Has a dog at home, not allergic to dog hair though We reviewed the allergy panel together done here; several low-grade food allergies, but allergist was not worried  He has high blood pressure; not taking any decongestants He has a lot going on with family/home issues He is trying to limit salt, slowing that down  Depression screen [facility] 2/9 04/05/2016 02/13/2016 10/11/2015 07/12/2015 06/08/2015  Decreased Interest 0 0 0 0 0  Down, Depressed, Hopeless 0 0 0 0 0  PHQ - 2 Score 0 0 0 0 0   Relevant past medical, surgical, family and social history reviewed Past Medical History  Diagnosis Date  . Allergy     history of as a child, did take shots  . Blood transfusion without reported diagnosis   . GERD (gastroesophageal reflux disease)   . Sleep apnea   . Hypertension     borderline history of  . Diverticulitis 2013    patient was hospitalized for over a week for this  . Food  allergy 02/21/2016   Social History  Substance Use Topics  . Smoking status: Never Smoker   . Smokeless tobacco: Never Used  . Alcohol Use: 1.2 oz/week    0 Standard drinks or equivalent, 2 Glasses of wine per week     Comment: wine nightly   Interim medical history since last visit reviewed. Allergies and medications reviewed  Review of Systems Per HPI unless specifically indicated above     Objective:    BP 128/82 mmHg  Pulse 82  Temp(Src) 98.6 F (37 C) (Oral)  Resp 16  Ht 5\' 6"  (1.676 m)  Wt 264 lb 12.8 oz (120.112 kg)  BMI 42.76 kg/m2  SpO2 98%  Wt Readings from Last 3 Encounters:  04/05/16 264 lb 12.8 oz (120.112 kg)  02/13/16 268 lb (121.564 kg)  10/11/15 259 lb 3.2 oz (117.572 kg)    Physical Exam  Constitutional: He appears well-developed and well-nourished. No distress.  Obese, weight down 3+ pounds over last 6 weeks  Cardiovascular: Normal rate.   Pulmonary/Chest: Effort normal.  Skin: No rash noted. He is not diaphoretic. No pallor.  Psychiatric: He has a normal mood and affect. His mood appears not anxious.   Results for orders placed or performed in visit on 02/13/16  Gliadin antibodies, serum  Result Value Ref Range   Antigliadin Abs, IgA 6 0 - 19 units   Gliadin IgG 2 0 - 19 units  Tissue transglutaminase, IgA  Result Value Ref Range   Transglutaminase IgA <2 0 - 3 U/mL  Reticulin Antibody, IgA w reflex titer  Result Value Ref Range   Reticulin Ab, IgA Negative Neg:<1:2.5 titer  Food Allergy Profile  Result Value Ref Range   Class Description Comment    Egg White IgE <0.10 Class 0 kU/L   Peanut IgE 0.28 (A) Class 0/I kU/L   Soybean IgE 0.10 (A) Class 0/I kU/L   Milk IgE <0.10 Class 0 kU/L   Clam IgE <0.10 Class 0 kU/L   Shrimp IgE <0.10 Class 0 kU/L   Walnut IgE 0.16 (A) Class 0/I kU/L   Codfish IgE <0.10 Class 0 kU/L   Scallop IgE <0.10 Class 0 kU/L   Wheat IgE 0.21 (A) Class 0/I kU/L   Allergen Corn, IgE 0.24 (A) Class 0/I kU/L    Sesame Seed IgE 0.31 (A) Class 0/I kU/L  CBC with Differential/Platelet  Result Value Ref Range   WBC 6.7 3.4 - 10.8 x10E3/uL   RBC 4.86 4.14 - 5.80 x10E6/uL   Hemoglobin 14.1 12.6 - 17.7 g/dL   Hematocrit 21.3 08.6 - 51.0 %   MCV 88 79 - 97 fL   MCH 29.0 26.6 - 33.0 pg   MCHC 32.9 31.5 - 35.7 g/dL   RDW 57.8 46.9 - 62.9 %   Platelets 233 150 - 379 x10E3/uL   Neutrophils 61 %   Lymphs 29 %   Monocytes 8 %   Eos 2 %   Basos 0 %   Neutrophils Absolute 4.2 1.4 - 7.0 x10E3/uL   Lymphocytes Absolute 1.9 0.7 - 3.1 x10E3/uL   Monocytes Absolute 0.5 0.1 - 0.9 x10E3/uL   EOS (ABSOLUTE) 0.1 0.0 - 0.4 x10E3/uL   Basophils Absolute 0.0 0.0 - 0.2 x10E3/uL   Immature Granulocytes 0 %   Immature Grans (Abs) 0.0 0.0 - 0.1 x10E3/uL  Basic metabolic panel  Result Value Ref Range   Glucose 90 65 - 99 mg/dL   BUN 14 6 - 24 mg/dL   Creatinine, Ser 5.28 0.76 - 1.27 mg/dL   GFR calc non Af Amer 88 >59 mL/min/1.73   GFR calc Af Amer 102 >59 mL/min/1.73   BUN/Creatinine Ratio 14 9 - 20   Sodium 141 134 - 144 mmol/L   Potassium 3.7 3.5 - 5.2 mmol/L   Chloride 97 96 - 106 mmol/L   CO2 25 18 - 29 mmol/L   Calcium 9.5 8.7 - 10.2 mg/dL      Assessment & Plan:   Problem List Items Addressed This Visit      Cardiovascular and Mediastinum   Hypertension goal BP (blood pressure) < 140/90    So glad to see patient's pressure has improved; continue working on weight loss, DASH guidelines, sodium reduction, etc.        Other   Obesity, Class III, BMI 40-49.9 (morbid obesity) (HCC) - Primary    We are working on this together; for the purposes of his health screening through work, I am glad to write something that says we are starting to work on this; goal 26-52 pounds of weight loss over the coming 12 months; slow and steady; see AVS      Food allergy    Very minimal; not a concern per allergist says patient  Follow up plan: Return in about 3 months (around 07/12/2016) for complete  physical.  An after-visit summary was printed and given to the patient at check-out.  Please see the patient instructions which may contain other information and recommendations beyond what is mentioned above in the assessment and plan.

## 2016-04-05 NOTE — Assessment & Plan Note (Signed)
We are working on this together; for the purposes of his health screening through work, I am glad to write something that says we are starting to work on this; goal 26-52 pounds of weight loss over the coming 12 months; slow and steady; see AVS

## 2016-04-05 NOTE — Assessment & Plan Note (Signed)
Very minimal; not a concern per allergist says patient

## 2016-05-19 LAB — CBC AND DIFFERENTIAL: HEMOGLOBIN: 5.8 g/dL — AB (ref 13.5–17.5)

## 2016-05-19 LAB — BASIC METABOLIC PANEL
Creatinine: 1.1 mg/dL (ref 0.6–1.3)
GLUCOSE: 91 mg/dL

## 2016-05-19 LAB — LIPID PANEL
Cholesterol: 184 mg/dL (ref 0–200)
HDL: 41 mg/dL (ref 35–70)
LDL Cholesterol: 122 mg/dL
TRIGLYCERIDES: 104 mg/dL (ref 40–160)

## 2016-06-18 ENCOUNTER — Ambulatory Visit: Payer: 59 | Admitting: Family Medicine

## 2016-06-28 ENCOUNTER — Encounter: Payer: Self-pay | Admitting: Family Medicine

## 2016-06-29 ENCOUNTER — Other Ambulatory Visit: Payer: Self-pay

## 2016-06-29 MED ORDER — CLOTRIMAZOLE-BETAMETHASONE 1-0.05 % EX LOTN: TOPICAL_LOTION | Freq: Two times a day (BID) | CUTANEOUS | 1 refills | Status: DC | PRN

## 2016-07-03 ENCOUNTER — Other Ambulatory Visit: Payer: Self-pay

## 2016-07-03 DIAGNOSIS — I10 Essential (primary) hypertension: Secondary | ICD-10-CM

## 2016-07-03 DIAGNOSIS — F411 Generalized anxiety disorder: Secondary | ICD-10-CM

## 2016-07-03 DIAGNOSIS — K219 Gastro-esophageal reflux disease without esophagitis: Secondary | ICD-10-CM

## 2016-07-03 MED ORDER — BUPROPION HCL ER (XL) 150 MG PO TB24: 150.0000 mg | ORAL_TABLET | Freq: Every day | ORAL | 3 refills | Status: DC

## 2016-07-03 MED ORDER — AMLODIPINE BESY-BENAZEPRIL HCL 5-20 MG PO CAPS: 1.0000 | ORAL_CAPSULE | Freq: Every day | ORAL | 1 refills | Status: DC

## 2016-07-03 MED ORDER — OMEPRAZOLE 40 MG PO CPDR: 40.0000 mg | DELAYED_RELEASE_CAPSULE | Freq: Every day | ORAL | 0 refills | Status: DC | PRN

## 2016-07-03 NOTE — Telephone Encounter (Signed)
Last labs checked; I called and left cautions about long-term PPI use

## 2016-07-03 NOTE — Telephone Encounter (Signed)
Pt needs 90 day supply

## 2016-07-16 ENCOUNTER — Ambulatory Visit (INDEPENDENT_AMBULATORY_CARE_PROVIDER_SITE_OTHER): Payer: 59 | Admitting: Family Medicine

## 2016-07-16 ENCOUNTER — Encounter: Payer: Self-pay | Admitting: Family Medicine

## 2016-07-16 VITALS — BP 126/78 | HR 97 | Temp 98.4°F | Resp 14 | Ht 66.0 in | Wt 256.0 lb

## 2016-07-16 DIAGNOSIS — Z23 Encounter for immunization: Secondary | ICD-10-CM

## 2016-07-16 DIAGNOSIS — Z0000 Encounter for general adult medical examination without abnormal findings: Secondary | ICD-10-CM | POA: Diagnosis not present

## 2016-07-16 NOTE — Progress Notes (Signed)
BP 126/78   Pulse 97   Temp 98.4 F (36.9 C) (Oral)   Resp 14   Ht 5\' 6"  (1.676 m)   Wt 256 lb (116.1 kg)   SpO2 98%   BMI 41.32 kg/m    Subjective:    Patient ID: Kevin Marshall ReasonMichael Brannen, male    DOB: 08/21/1961, 55 y.o.   MRN: [id number]  HPI: Kevin Marshall ReasonMichael Wilhelmi is a 55 y.o. male  Chief Complaint  Patient presents with  . Annual Exam   .  [facility] 07/16/2016 04/05/2016 02/13/2016 10/11/2015 07/12/2015  Decreased Interest 0 0 0 0 0  Down, Depressed, Hopeless 0 0 0 0 0  PHQ - 2 Score 0 0 0 0 0    No flowsheet data found.  Relevant past medical, surgical, family and social history reviewed Past Medical History:  Diagnosis Date  . Allergy    history of as a child, did take shots  . Blood transfusion without reported diagnosis   . Diverticulitis 2013   patient was hospitalized for over a week for this  . Food allergy 02/21/2016  . GERD (gastroesophageal reflux disease)   . Hypertension    borderline history of  . Sleep apnea    Past Surgical History:  Procedure Laterality Date  . ACHILLES TENDON SURGERY    . HERNIA REPAIR     3   Family History  Problem Relation Age of Onset  . Diabetes Mother   . Hypertension Father    Social History  Substance Use Topics  . Smoking status: Never Smoker  . Smokeless tobacco: Never Used  . Alcohol use 1.2 oz/week    2 Glasses of wine per week     Comment: wine nightly    Interim medical history since last visit reviewed. Allergies and medications reviewed  Review of Systems Per HPI unless specifically indicated above     Objective:    BP 126/78   Pulse 97   Temp 98.4 F (36.9 C) (Oral)   Resp 14   Ht 5\' 6"  (1.676 m)   Wt 256 lb (116.1 kg)   SpO2 98%   BMI 41.32 kg/m   Wt Readings from Last 3 Encounters:  07/16/16 256 lb (116.1 kg)  04/05/16 264 lb 12.8 oz (120.1 kg)  02/13/16 268 lb (121.6 kg)    Physical Exam  Results for orders placed or performed in visit on 02/13/16  Gliadin antibodies,  serum  Result Value Ref Range   Antigliadin Abs, IgA 6 0 - 19 units   Gliadin IgG 2 0 - 19 units  Tissue transglutaminase, IgA  Result Value Ref Range   Transglutaminase IgA <2 0 - 3 U/mL  Reticulin Antibody, IgA w reflex titer  Result Value Ref Range   Reticulin Ab, IgA Negative Neg:<1:2.5 titer  Food Allergy Profile  Result Value Ref Range   Class Description Comment    Egg White IgE <0.10 Class 0 kU/L   Peanut IgE 0.28 (A) Class 0/I kU/L   Soybean IgE 0.10 (A) Class 0/I kU/L   Milk IgE <0.10 Class 0 kU/L   Clam IgE <0.10 Class 0 kU/L   Shrimp IgE <0.10 Class 0 kU/L   Walnut IgE 0.16 (A) Class 0/I kU/L   Codfish IgE <0.10 Class 0 kU/L   Scallop IgE <0.10 Class 0 kU/L   Wheat IgE 0.21 (A) Class 0/I kU/L   Allergen Corn, IgE 0.24 (A) Class 0/I kU/L   Sesame Seed IgE 0.31 (A) Class  0/I kU/L  CBC with Differential/Platelet  Result Value Ref Range   WBC 6.7 3.4 - 10.8 x10E3/uL   RBC 4.86 4.14 - 5.80 x10E6/uL   Hemoglobin 14.1 12.6 - 17.7 g/dL   Hematocrit 16.1 09.6 - 51.0 %   MCV 88 79 - 97 fL   MCH 29.0 26.6 - 33.0 pg   MCHC 32.9 31.5 - 35.7 g/dL   RDW 04.5 40.9 - 81.1 %   Platelets 233 150 - 379 x10E3/uL   Neutrophils 61 %   Lymphs 29 %   Monocytes 8 %   Eos 2 %   Basos 0 %   Neutrophils Absolute 4.2 1.4 - 7.0 x10E3/uL   Lymphocytes Absolute 1.9 0.7 - 3.1 x10E3/uL   Monocytes Absolute 0.5 0.1 - 0.9 x10E3/uL   EOS (ABSOLUTE) 0.1 0.0 - 0.4 x10E3/uL   Basophils Absolute 0.0 0.0 - 0.2 x10E3/uL   Immature Granulocytes 0 %   Immature Grans (Abs) 0.0 0.0 - 0.1 x10E3/uL  Basic metabolic panel  Result Value Ref Range   Glucose 90 65 - 99 mg/dL   BUN 14 6 - 24 mg/dL   Creatinine, Ser 9.14 0.76 - 1.27 mg/dL   GFR calc non Af Amer 88 >59 mL/min/1.73   GFR calc Af Amer 102 >59 mL/min/1.73   BUN/Creatinine Ratio 14 9 - 20   Sodium 141 134 - 144 mmol/L   Potassium 3.7 3.5 - 5.2 mmol/L   Chloride 97 96 - 106 mmol/L   CO2 25 18 - 29 mmol/L   Calcium 9.5 8.7 - 10.2 mg/dL       Assessment & Plan:   Problem List Items Addressed This Visit    None    Visit Diagnoses   None.      Follow up plan: No Follow-up on file.  An after-visit summary was printed and given to the patient at check-out.  Please see the patient instructions which may contain other information and recommendations beyond what is mentioned above in the assessment and plan.  Meds ordered this encounter  Medications  . levocetirizine (XYZAL) 5 MG tablet    Sig: Take 5 mg by mouth daily.    No orders of the defined types were placed in this encounter.

## 2016-07-16 NOTE — Patient Instructions (Addendum)
Check out the information at familydoctor.org entitled "Nutrition for Weight Loss: What You Need to Know about Fad Diets" Try to lose between 1-2 pounds per week by taking in fewer calories and burning off more calories You can succeed by limiting portions, limiting foods dense in calories and fat, becoming more active, and drinking 8 glasses of water a day (64 ounces) Don't skip meals, especially breakfast, as skipping meals may alter your metabolism Do not use over-the-counter weight loss pills or gimmicks that claim rapid weight loss A healthy BMI (or body mass index) is between 18.5 and 24.9 You can calculate your ideal BMI at the NIH website [employer]  Try to limit saturated fats in your diet (bologna, hot dogs, barbeque, cheeseburgers, hamburgers, steak, bacon, sausage, cheese, etc.) and get more fresh fruits, vegetables, and whole grains  Good luck on your journey!  Try to use PLAIN allergy medicine without the decongestant Avoid: phenylephrine, phenylpropanolamine, and pseudoephredine  Health Maintenance, Male A healthy lifestyle and preventative care can promote health and wellness.  Maintain regular health, dental, and eye exams.  Eat a healthy diet. Foods like vegetables, fruits, whole grains, low-fat dairy products, and lean protein foods contain the nutrients you need and are low in calories. Decrease your intake of foods high in solid fats, added sugars, and salt. Get information about a proper diet from your health care provider, if necessary.  Regular physical exercise is one of the most important things you can do for your health. Most adults should get at least 150 minutes of moderate-intensity exercise (any activity that increases your heart rate and causes you to sweat) each week. In addition, most adults need muscle-strengthening exercises on 2 or more days a week.   Maintain a healthy weight. The body mass  index (BMI) is a screening tool to identify possible weight problems. It provides an estimate of body fat based on height and weight. Your health care provider can find your BMI and can help you achieve or maintain a healthy weight. For males 20 years and older:  A BMI below 18.5 is considered underweight.  A BMI of 18.5 to 24.9 is normal.  A BMI of 25 to 29.9 is considered overweight.  A BMI of 30 and above is considered obese.  Maintain normal blood lipids and cholesterol by exercising and minimizing your intake of saturated fat. Eat a balanced diet with plenty of fruits and vegetables. Blood tests for lipids and cholesterol should begin at age 36 and be repeated every 5 years. If your lipid or cholesterol levels are high, you are over age 25, or you are at high risk for heart disease, you may need your cholesterol levels checked more frequently.Ongoing high lipid and cholesterol levels should be treated with medicines if diet and exercise are not working.  If you smoke, find out from your health care provider how to quit. If you do not use tobacco, do not start.  Lung cancer screening is recommended for adults aged 55-80 years who are at high risk for developing lung cancer because of a history of smoking. A yearly low-dose CT scan of the lungs is recommended for people who have at least a 30-pack-year history of smoking and are current smokers or have quit within the past 15 years. A pack year of smoking is smoking an average of 1 pack of cigarettes a day for 1 year (for example, a 30-pack-year history of smoking could mean smoking 1 pack a day for 30 years or 2  packs a day for 15 years). Yearly screening should continue until the smoker has stopped smoking for at least 15 years. Yearly screening should be stopped for people who develop a health problem that would prevent them from having lung cancer treatment.  If you choose to drink alcohol, do not have more than 2 drinks per day. One drink is  considered to be 12 oz (360 mL) of beer, 5 oz (150 mL) of wine, or 1.5 oz (45 mL) of liquor.  Avoid the use of street drugs. Do not share needles with anyone. Ask for help if you need support or instructions about stopping the use of drugs.  High blood pressure causes heart disease and increases the risk of stroke. High blood pressure is more likely to develop in:  People who have blood pressure in the end of the normal range (100-139/85-89 mm Hg).  People who are overweight or obese.  People who are African American.  If you are 2518-53 years of age, have your blood pressure checked every 3-5 years. If you are 53 years of age or older, have your blood pressure checked every year. You should have your blood pressure measured twice--once when you are at a hospital or clinic, and once when you are not at a hospital or clinic. Record the average of the two measurements. To check your blood pressure when you are not at a hospital or clinic, you can use:  An automated blood pressure machine at a pharmacy.  A home blood pressure monitor.  If you are 7845-368 years old, ask your health care provider if you should take aspirin to prevent heart disease.  Diabetes screening involves taking a blood sample to check your fasting blood sugar level. This should be done once every 3 years after age 53 if you are at a normal weight and without risk factors for diabetes. Testing should be considered at a younger age or be carried out more frequently if you are overweight and have at least 1 risk factor for diabetes.  Colorectal cancer can be detected and often prevented. Most routine colorectal cancer screening begins at the age of 53 and continues through age 175. However, your health care provider may recommend screening at an earlier age if you have risk factors for colon cancer. On a yearly basis, your health care provider may provide home test kits to check for hidden blood in the stool. A small camera at the end  of a tube may be used to directly examine the colon (sigmoidoscopy or colonoscopy) to detect the earliest forms of colorectal cancer. Talk to your health care provider about this at age 53 when routine screening begins. A direct exam of the colon should be repeated every 5-10 years through age 53, unless early forms of precancerous polyps or small growths are found.  People who are at an increased risk for hepatitis B should be screened for this virus. You are considered at high risk for hepatitis B if:  You were born in a country where hepatitis B occurs often. Talk with your health care provider about which countries are considered high risk.  Your parents were born in a high-risk country and you have not received a shot to protect against hepatitis B (hepatitis B vaccine).  You have HIV or AIDS.  You use needles to inject street drugs.  You live with, or have sex with, someone who has hepatitis B.  You are a man who has sex with other men (MSM).  You get hemodialysis treatment.  You take certain medicines for conditions like cancer, organ transplantation, and autoimmune conditions.  Hepatitis C blood testing is recommended for all people born from [birth year] through 1963 and any individual with known risk factors for hepatitis C.  Healthy men should no longer receive prostate-specific antigen (PSA) blood tests as part of routine cancer screening. Talk to your health care provider about prostate cancer screening.  Testicular cancer screening is not recommended for adolescents or adult males who have no symptoms. Screening includes self-exam, a health care provider exam, and other screening tests. Consult with your health care provider about any symptoms you have or any concerns you have about testicular cancer.  Practice safe sex. Use condoms and avoid high-risk sexual practices to reduce the spread of sexually transmitted infections (STIs).  You should be screened for STIs, including  gonorrhea and chlamydia if:  You are sexually active and are younger than 24 years.  You are older than 24 years, and your health care provider tells you that you are at risk for this type of infection.  Your sexual activity has changed since you were last screened, and you are at an increased risk for chlamydia or gonorrhea. Ask your health care provider if you are at risk.  If you are at risk of being infected with HIV, it is recommended that you take a prescription medicine daily to prevent HIV infection. This is called pre-exposure prophylaxis (PrEP). You are considered at risk if:  You are a man who has sex with other men (MSM).  You are a heterosexual man who is sexually active with multiple partners.  You take drugs by injection.  You are sexually active with a partner who has HIV.  Talk with your health care provider about whether you are at high risk of being infected with HIV. If you choose to begin PrEP, you should first be tested for HIV. You should then be tested every 3 months for as long as you are taking PrEP.  Use sunscreen. Apply sunscreen liberally and repeatedly throughout the day. You should seek shade when your shadow is shorter than you. Protect yourself by wearing long sleeves, pants, a wide-brimmed hat, and sunglasses year round whenever you are outdoors.  Tell your health care provider of new moles or changes in moles, especially if there is a change in shape or color. Also, tell your health care provider if a mole is larger than the size of a pencil eraser.  A one-time screening for abdominal aortic aneurysm (AAA) and surgical repair of large AAAs by ultrasound is recommended for men aged 65-75 years who are current or former smokers.  Stay current with your vaccines (immunizations).   This information is not intended to replace advice given to you by your health care provider. Make sure you discuss any questions you have with your health care provider.    Document Released: 04/05/2008 Document Revised: 10/29/2014 Document Reviewed: 03/05/2011 Elsevier Interactive Patient Education [employer]

## 2016-07-16 NOTE — Progress Notes (Signed)
Patient ID: Kevin Marshall, male   DOB: [birth year], 55 y.o.   MRN: [id number]   Subjective:   Kevin Marshall is a 55 y.o. male here for a complete physical exam  Interim issues since last visit: none; had labs done   USPSTF grade A and B recommendations Alcohol: just on the weekends Depression:  Depression screen [facility] 2/9 07/16/2016 04/05/2016 02/13/2016 10/11/2015 07/12/2015  Decreased Interest 0 0 0 0 0  Down, Depressed, Hopeless 0 0 0 0 0  PHQ - 2 Score 0 0 0 0 0   Hypertension: controlled Obesity: discussed; he will try to lose 1 pound per week; see form Tobacco use: never HIV, hep B, hep C: offered STD testing and prevention (chl/gon/syphilis): declined Lipids: just done in July, see copy; LDL 122; he wants to try on his own, does not want medicine Glucose: just done in July, see copy Colorectal cancer: UTD says patient, had a few in a row, was having rectal bleeding several years ago; 10 year pass after last; no fam hx Breast cancer: no lumps Lung cancer: n/a Aspirin: not taking aspirin, he just forgot Diet: eats red meat, willing to cut back; getting calcium in almond milk Exercise: will start to build up gradually Skin cancer: skin tags, nothing worrisome PSA: discussed, declined Flu vaccine: recommended today; given  He has had some drainage; lots of sinus drainage; using xyzal; taking that and it's drying up  Past Medical History:  Diagnosis Date  . Allergy    history of as a child, did take shots  . Blood transfusion without reported diagnosis   . Diverticulitis 2013   patient was hospitalized for over a week for this  . Food allergy 02/21/2016  . GERD (gastroesophageal reflux disease)   . Hypertension    borderline history of  . Sleep apnea    Past Surgical History:  Procedure Laterality Date  . ACHILLES TENDON SURGERY    . HERNIA REPAIR     3   Family History  Problem Relation Age of Onset  . Diabetes Mother   . Hypertension Father    Social History   Substance Use Topics  . Smoking status: Never Smoker  . Smokeless tobacco: Never Used  . Alcohol use 1.2 oz/week    2 Glasses of wine per week     Comment: wine nightly  MD note: drinking on the weekends  Review of Systems  Objective:   Vitals:   07/16/16 1410  BP: 126/78  Pulse: 97  Resp: 14  Temp: 98.4 F (36.9 C)  TempSrc: Oral  SpO2: 98%  Weight: 256 lb (116.1 kg)  Height: 5\' 6"  (1.676 m)   Body mass index is 41.32 kg/m. Wt Readings from Last 3 Encounters:  07/16/16 256 lb (116.1 kg)  04/05/16 264 lb 12.8 oz (120.1 kg)  02/13/16 268 lb (121.6 kg)   Physical Exam  Constitutional: He appears well-developed and well-nourished. No distress.  HENT:  Head: Normocephalic and atraumatic.  Nose: Nose normal.  Mouth/Throat: Oropharynx is clear and moist.  Eyes: EOM are normal. No scleral icterus.  Neck: No JVD present. No thyromegaly present.  Cardiovascular: Normal rate, regular rhythm and normal heart sounds.   Pulmonary/Chest: Effort normal and breath sounds normal. No respiratory distress. He has no wheezes. He has no rales.  Abdominal: Soft. Bowel sounds are normal. He exhibits no distension. There is no tenderness. There is no guarding.  Musculoskeletal: Normal range of motion. He exhibits no edema.  Lymphadenopathy:  He has no cervical adenopathy.  Neurological: He is alert. He displays normal reflexes. He exhibits normal muscle tone. Coordination normal.  Skin: Skin is warm and dry. No rash noted. He is not diaphoretic. No erythema. No pallor.  Psychiatric: He has a normal mood and affect. His behavior is normal. Judgment and thought content normal.   Assessment/Plan:   Problem List Items Addressed This Visit      Other   Annual physical exam - Primary    USPSTF grade A and B recommendations reviewed with patient; age-appropriate recommendations, preventive care, screening tests, etc discussed and encouraged; healthy living encouraged; see AVS for patient  education given to patient       Other Visit Diagnoses    Needs flu shot       Relevant Orders   Flu Vaccine QUAD 36+ mos PF IM (Fluarix & Fluzone Quad PF) (Completed)      Meds ordered this encounter  Medications  . levocetirizine (XYZAL) 5 MG tablet    Sig: Take 5 mg by mouth daily.   Orders Placed This Encounter  Procedures  . Flu Vaccine QUAD 36+ mos PF IM (Fluarix & Fluzone Quad PF)    Follow up plan: Return in about 6 months (around 01/13/2017) for fasting labs, cholesterol, visit with Dr. Sherie DonLada; 1 year next physical.  An After Visit Summary was printed and given to the patient.

## 2016-07-17 ENCOUNTER — Other Ambulatory Visit: Payer: Self-pay

## 2016-07-18 NOTE — Assessment & Plan Note (Signed)
USPSTF grade A and B recommendations reviewed with patient; age-appropriate recommendations, preventive care, screening tests, etc discussed and encouraged; healthy living encouraged; see AVS for patient education given to patient  

## 2016-07-23 ENCOUNTER — Encounter: Payer: Self-pay | Admitting: Family Medicine

## 2016-08-02 ENCOUNTER — Encounter: Payer: Self-pay | Admitting: Family Medicine

## 2016-08-02 ENCOUNTER — Ambulatory Visit (INDEPENDENT_AMBULATORY_CARE_PROVIDER_SITE_OTHER): Payer: 59 | Admitting: Family Medicine

## 2016-08-02 DIAGNOSIS — J189 Pneumonia, unspecified organism: Secondary | ICD-10-CM

## 2016-08-02 MED ORDER — HYDROCOD POLST-CPM POLST ER 10-8 MG/5ML PO SUER: 5.0000 mL | Freq: Two times a day (BID) | ORAL | 0 refills | Status: DC | PRN

## 2016-08-02 MED ORDER — LEVOFLOXACIN 500 MG PO TABS: 500.0000 mg | ORAL_TABLET | Freq: Every day | ORAL | 0 refills | Status: AC

## 2016-08-02 NOTE — Progress Notes (Signed)
BP 124/74 (BP Location: Right Arm, Patient Position: Sitting, Cuff Size: Normal)   Pulse 81   Temp 98.5 F (36.9 C) (Oral)   Resp 16   Ht 5\' 6"  (1.676 m)   Wt 260 lb 7 oz (118.1 kg)   SpO2 96%   BMI 42.04 kg/m    Subjective:    Patient ID: Kevin Marshall, male    DOB: [birth year], 55 y.o.   MRN: [id number]  HPI: Kevin Marshall is a 55 y.o. male  Chief Complaint  Patient presents with  . Follow-up    Urgent care for pneumonia   Patient is here for follow-up after developing pneumonia He was seen at urgent care ([facility]) on July 26, 2016 Pulse ox was 97% at that visit, respiratory rate 14, pulse 88, temp 98.2 Abnormal pulmonary exam, revealed decreased breath sounds and crackles over the left lower lob He was treated with azithromycin and tessalon perles  Still coughing up phelgm; little bit of yellow, then cleary, then yellow; not really any better Not sleeping well; wearing mask; trying to cough He gets coughing spells and then get a glob out  [facility]/02/2016 07/16/2016 04/05/2016 02/13/2016 10/11/2015  Decreased Interest 0 0 0 0 0  Down, Depressed, Hopeless 0 0 0 0 0  PHQ - 2 Score 0 0 0 0 0   Relevant past medical, surgical, family and social history reviewed Past Medical History:  Diagnosis Date  . Allergy    history of as a child, did take shots  . Blood transfusion without reported diagnosis   . Diverticulitis 2013   patient was hospitalized for over a week for this  . Food allergy 02/21/2016  . GERD (gastroesophageal reflux disease)   . Hypertension    borderline history of  . Sleep apnea    Family History  Problem Relation Age of Onset  . Diabetes Mother   . Hypertension Father    Social History  Substance Use Topics  . Smoking status: Never Smoker  . Smokeless tobacco: Never Used  . Alcohol use 1.2 oz/week    2 Glasses of wine per week     Comment: wine nightly   Interim medical history since last visit reviewed. Allergies  and medications reviewed  Review of Systems Per HPI unless specifically indicated above     Objective:    BP 124/74 (BP Location: Right Arm, Patient Position: Sitting, Cuff Size: Normal)   Pulse 81   Temp 98.5 F (36.9 C) (Oral)   Resp 16   Ht 5\' 6"  (1.676 m)   Wt 260 lb 7 oz (118.1 kg)   SpO2 96%   BMI 42.04 kg/m   Wt Readings from Last 3 Encounters:  08/02/16 260 lb 7 oz (118.1 kg)  07/16/16 256 lb (116.1 kg)  04/05/16 264 lb 12.8 oz (120.1 kg)    Physical Exam  Constitutional: He appears well-developed and well-nourished. No distress.  HENT:  Right Ear: Hearing, tympanic membrane, external ear and ear canal normal.  Left Ear: Hearing, tympanic membrane, external ear and ear canal normal.  Nose: No rhinorrhea.  Mouth/Throat: Oropharynx is clear and moist.  Cardiovascular: Normal rate and regular rhythm.   Pulmonary/Chest: No accessory muscle usage. No tachypnea. No respiratory distress. He has decreased breath sounds. He has rhonchi.  Lymphadenopathy:    He has no cervical adenopathy.       Right: No supraclavicular adenopathy present.       Left: No supraclavicular adenopathy present.  Skin: He is not diaphoretic. No pallor.      Assessment & Plan:   Problem List Items Addressed This Visit      Respiratory   Pneumonia involving left lung    Patient does sound to have pneumonia; will start Levaquin; tussionex; cautions given re: tussionex; will skip radiation of CXR since it won't change plan; call or seek care if needed; pt and wife agree      Relevant Medications   levofloxacin (LEVAQUIN) 500 MG tablet   chlorpheniramine-HYDROcodone (TUSSIONEX PENNKINETIC ER) 10-8 MG/5ML SUER    Other Visit Diagnoses   None.      Follow up plan: No Follow-up on file.  An after-visit summary was printed and given to the patient at check-out.  Please see the patient instructions which may contain other information and recommendations beyond what is mentioned above in the  assessment and plan.  Meds ordered this encounter  Medications  . DISCONTD: azithromycin (ZITHROMAX) 250 MG tablet  . DISCONTD: benzonatate (TESSALON) 100 MG capsule  . levofloxacin (LEVAQUIN) 500 MG tablet    Sig: Take 1 tablet (500 mg total) by mouth daily.    Dispense:  7 tablet    Refill:  0  . chlorpheniramine-HYDROcodone (TUSSIONEX PENNKINETIC ER) 10-8 MG/5ML SUER    Sig: Take 5 mLs by mouth every 12 (twelve) hours as needed for cough.    Dispense:  115 mL    Refill:  0    No orders of the defined types were placed in this encounter.

## 2016-08-02 NOTE — Patient Instructions (Signed)
Start the new antibiotic Use the new cough syrup if needed Never ever mix with alcohol or other sedatives Please do eat yogurt daily or take a probiotic daily for the next month or two We want to replace the healthy germs in the gut If you notice foul, watery diarrhea in the next two months, schedule an appointment RIGHT AWAY Call if needed

## 2016-08-04 DIAGNOSIS — J189 Pneumonia, unspecified organism: Secondary | ICD-10-CM | POA: Insufficient documentation

## 2016-08-04 NOTE — Assessment & Plan Note (Signed)
Patient does sound to have pneumonia; will start Levaquin; tussionex; cautions given re: tussionex; will skip radiation of CXR since it won't change plan; call or seek care if needed; pt and wife agree

## 2016-08-21 ENCOUNTER — Other Ambulatory Visit: Payer: Self-pay | Admitting: Family Medicine

## 2016-08-21 DIAGNOSIS — K219 Gastro-esophageal reflux disease without esophagitis: Secondary | ICD-10-CM

## 2016-08-22 ENCOUNTER — Ambulatory Visit (INDEPENDENT_AMBULATORY_CARE_PROVIDER_SITE_OTHER): Payer: 59 | Admitting: Family Medicine

## 2016-08-22 ENCOUNTER — Encounter: Payer: Self-pay | Admitting: Family Medicine

## 2016-08-22 ENCOUNTER — Ambulatory Visit
Admission: RE | Admit: 2016-08-22 | Discharge: 2016-08-22 | Disposition: A | Discharge status: Home / Self Care | Payer: 59 | Source: Ambulatory Visit | Attending: Family Medicine | Admitting: Family Medicine

## 2016-08-22 DIAGNOSIS — R05 Cough: Secondary | ICD-10-CM | POA: Diagnosis present

## 2016-08-22 DIAGNOSIS — J984 Other disorders of lung: Secondary | ICD-10-CM

## 2016-08-22 DIAGNOSIS — J309 Allergic rhinitis, unspecified: Secondary | ICD-10-CM | POA: Diagnosis not present

## 2016-08-22 DIAGNOSIS — K219 Gastro-esophageal reflux disease without esophagitis: Secondary | ICD-10-CM

## 2016-08-22 DIAGNOSIS — J189 Pneumonia, unspecified organism: Secondary | ICD-10-CM | POA: Insufficient documentation

## 2016-08-22 DIAGNOSIS — I10 Essential (primary) hypertension: Secondary | ICD-10-CM | POA: Diagnosis not present

## 2016-08-22 DIAGNOSIS — R911 Solitary pulmonary nodule: Secondary | ICD-10-CM | POA: Diagnosis not present

## 2016-08-22 MED ORDER — BENZONATATE 100 MG PO CAPS: 100.0000 mg | ORAL_CAPSULE | Freq: Three times a day (TID) | ORAL | 0 refills | Status: DC | PRN

## 2016-08-22 MED ORDER — OMEPRAZOLE 20 MG PO CPDR: 20.0000 mg | DELAYED_RELEASE_CAPSULE | Freq: Every day | ORAL | 0 refills | Status: DC

## 2016-08-22 NOTE — Patient Instructions (Signed)
Step down from omeprazole from 40 mg daily to 20 mg Call me when you get down to the last few pills of this new bottle and we'll start the new 300 mg ranitidine Caution: prolonged use of proton pump inhibitors like omeprazole (Prilosec), pantoprazole (Protonix), esomeprazole (Nexium), and others like Dexilant and Aciphex may increase your risk of pneumonia, Clostridium difficile colitis, osteoporosis, anemia and other health complications Try to limit or avoid triggers like coffee, caffeinated beverages, onions, chocolate, spicy foods, peppermint, acid foods like pizza, spaghetti sauce, and orange juice Lose weight if you are overweight or obese Try elevating the head of your bed by placing a small wedge between your mattress and box springs to keep acid in the stomach at night instead of coming up into your esophagus  Please do get a chest xray across the street Stay on the allergy pill and nasal spray If not resolving in next 1-2 weeks, call me and we'll add singulair Use the tessalon perles if needed for cough

## 2016-08-22 NOTE — Progress Notes (Signed)
BP 126/80   Pulse 90   Temp 98.2 F (36.8 C)   Resp 14   Ht 5\' 6"  (1.676 m)   Wt 256 lb (116.1 kg)   SpO2 97%   BMI 41.32 kg/m    Subjective:    Patient ID: Kevin Marshall Batch, male    DOB: 05-14-1961, 55 y.o.   MRN: [id number]  HPI: Kevin Marshall Meals is a 55 y.o. male  Chief Complaint  Patient presents with  . Follow-up    still have cough    He had pneumonia not long ago, still coughing, wants to be checked out Still has a little nagging cough now and then; he made the appt last week; his wife came in and saw Dr. Sherryll BurgerShah here; she had a sinus infection and she is doing much better; patient still has some mucous, little bit of cough, but getting better; goes in spells; over the past weekend, he started coughing and coughing up mucous, thick, sometimes clear or clear to yellow, mixture; will go away; then next day, not all gone No fevers; no night sweats He has tried OTC cough medicine, mucinex equivalent, generic helps, breaks it up He has stopped drinking ice water; drinking hot tea Able to have conversation without losing breath He thinks some of it is still allergies and drainage; taking OTC allergy medicines; drainage down the back He was seen on August 02, 2016 for his pneumonia; he had already tried tessalon perles  He has GERD and has been taking 40 mg of omeprazole  Tramadol just PRN; not drinking alcohol with that; not taking the hydrocodone cough medicine either; removing from med list  [facility] 08/22/2016 08/02/2016 07/16/2016 04/05/2016 02/13/2016  Decreased Interest 0 0 0 0 0  Down, Depressed, Hopeless 0 0 0 0 0  PHQ - 2 Score 0 0 0 0 0   Relevant past medical, surgical, family and social history reviewed Past Medical History:  Diagnosis Date  . Allergy    history of as a child, did take shots  . Blood transfusion without reported diagnosis   . Diverticulitis 2013   patient was hospitalized for over a week for this  . Food allergy 02/21/2016  . GERD  (gastroesophageal reflux disease)   . Hypertension    borderline history of  . Sleep apnea    Past Surgical History:  Procedure Laterality Date  . ACHILLES TENDON SURGERY    . HERNIA REPAIR     3   Family History  Problem Relation Age of Onset  . Diabetes Mother   . Hypertension Father    Social History  Substance Use Topics  . Smoking status: Never Smoker  . Smokeless tobacco: Never Used  . Alcohol use 1.2 oz/week    2 Glasses of wine per week     Comment: wine nightly   Interim medical history since last visit reviewed. Allergies and medications reviewed  Review of Systems Per HPI unless specifically indicated above     Objective:    BP 126/80   Pulse 90   Temp 98.2 F (36.8 C)   Resp 14   Ht 5\' 6"  (1.676 m)   Wt 256 lb (116.1 kg)   SpO2 97%   BMI 41.32 kg/m   Wt Readings from Last 3 Encounters:  08/22/16 256 lb (116.1 kg)  08/02/16 260 lb 7 oz (118.1 kg)  07/16/16 256 lb (116.1 kg)    Physical Exam  Constitutional: He appears well-developed and well-nourished. No  distress.  Morbidly obese  HENT:  Right Ear: Hearing and external ear normal.  Left Ear: Hearing and external ear normal.  Nose: No rhinorrhea.  Mouth/Throat: Oropharynx is clear and moist and mucous membranes are normal.  Cerumen in canals  Eyes: EOM are normal. Right eye exhibits no discharge. Left eye exhibits no discharge.  Cardiovascular: Normal rate and regular rhythm.   Pulmonary/Chest: Breath sounds normal. No accessory muscle usage. No tachypnea. No respiratory distress. He has no decreased breath sounds. He has no wheezes. He has no rhonchi.  Lymphadenopathy:    He has no cervical adenopathy.       Right: No supraclavicular adenopathy present.       Left: No supraclavicular adenopathy present.  Neurological: He is alert.  Skin: He is not diaphoretic. No pallor.   Results for orders placed or performed in visit on 07/17/16  CBC and differential  Result Value Ref Range    Hemoglobin 5.8 (A) 13.5 - 17.5 g/dL  Basic metabolic panel  Result Value Ref Range   Glucose 91 mg/dL   Creatinine 1.1 0.6 - 1.3 mg/dL  Lipid panel  Result Value Ref Range   Triglycerides 104 40 - 160 mg/dL   Cholesterol 161184 0 - 096200 mg/dL   HDL 41 35 - 70 mg/dL   LDL Cholesterol 045122 mg/dL      Assessment & Plan:   Problem List Items Addressed This Visit      Cardiovascular and Mediastinum   Hypertension goal BP (blood pressure) < 140/90    controlled        Respiratory   Pneumonia involving left lung    Will get f/u chest xray      Relevant Medications   fluticasone (FLONASE) 50 MCG/ACT nasal spray   benzonatate (TESSALON PERLES) 100 MG capsule   Other Relevant Orders   DG Chest 2 View   Allergic rhinitis    Continue fluticasone and xyzal; will add singulair if cough not improving over next 1-2 weeks        Digestive   GERD without esophagitis    Explained the risks of PPI use; will step him down from 40 mg daily to 20 mg daily x 1 month, then switch to H2 blocker; I asked him to call me when down to a few pills left in the bottle to start the new medicine; see AVS; avoid triggers; work on weight loss       Other Visit Diagnoses   None.      Follow up plan: Return if symptoms worsen or fail to improve.  An after-visit summary was printed and given to the patient at check-out.  Please see the patient instructions which may contain other information and recommendations beyond what is mentioned above in the assessment and plan.  Meds ordered this encounter  Medications  . DISCONTD: benzonatate (TESSALON PERLES) 100 MG capsule    Sig: Take 1 capsule (100 mg total) by mouth every 8 (eight) hours as needed for cough.    Dispense:  30 capsule    Refill:  0  . fluticasone (FLONASE) 50 MCG/ACT nasal spray    Sig: Place 2 sprays into both nostrils daily.  . benzonatate (TESSALON PERLES) 100 MG capsule    Sig: Take 1 capsule (100 mg total) by mouth every 8 (eight)  hours as needed for cough.    Dispense:  30 capsule    Refill:  0    Orders Placed This Encounter  Procedures  .  DG Chest 2 View

## 2016-08-22 NOTE — Telephone Encounter (Signed)
I called and left brief msg

## 2016-08-22 NOTE — Assessment & Plan Note (Signed)
Explained the risks of PPI use; will step him down from 40 mg daily to 20 mg daily x 1 month, then switch to H2 blocker; I asked him to call me when down to a few pills left in the bottle to start the new medicine; see AVS; avoid triggers; work on weight loss

## 2016-08-22 NOTE — Assessment & Plan Note (Signed)
Will get f/u chest xray

## 2016-08-22 NOTE — Telephone Encounter (Signed)
Please let pt know that I'd like to get him off of the high dose omeprazole This drug has risks and actually increases the risk of pneumonia I'm going to step him down to 20 mg omeprazole daily for one month, Rx sent to local pharmacy, then we'll switch to another drug In the meantime, so very important he avoids foods that are triggers, lose weight, elevate head of bed, etc.

## 2016-08-22 NOTE — Assessment & Plan Note (Signed)
controlled 

## 2016-08-22 NOTE — Assessment & Plan Note (Signed)
Continue fluticasone and xyzal; will add singulair if cough not improving over next 1-2 weeks

## 2016-08-23 DIAGNOSIS — J984 Other disorders of lung: Secondary | ICD-10-CM | POA: Insufficient documentation

## 2016-08-23 NOTE — Assessment & Plan Note (Signed)
Order chest CT

## 2016-08-23 NOTE — Telephone Encounter (Signed)
Note to pt about CXR; order chest CT

## 2016-08-28 ENCOUNTER — Ambulatory Visit: Payer: 59 | Admitting: Family Medicine

## 2016-09-17 ENCOUNTER — Ambulatory Visit
Admission: RE | Admit: 2016-09-17 | Discharge: 2016-09-17 | Disposition: A | Discharge status: Home / Self Care | Payer: 59 | Source: Ambulatory Visit | Attending: Family Medicine | Admitting: Family Medicine

## 2016-09-17 DIAGNOSIS — J984 Other disorders of lung: Secondary | ICD-10-CM | POA: Insufficient documentation

## 2016-09-17 DIAGNOSIS — K760 Fatty (change of) liver, not elsewhere classified: Secondary | ICD-10-CM | POA: Insufficient documentation

## 2016-09-17 DIAGNOSIS — J8410 Pulmonary fibrosis, unspecified: Secondary | ICD-10-CM | POA: Insufficient documentation

## 2016-09-17 DIAGNOSIS — I313 Pericardial effusion (noninflammatory): Secondary | ICD-10-CM | POA: Insufficient documentation

## 2016-09-17 DIAGNOSIS — M47894 Other spondylosis, thoracic region: Secondary | ICD-10-CM | POA: Insufficient documentation

## 2016-09-17 DIAGNOSIS — I898 Other specified noninfective disorders of lymphatic vessels and lymph nodes: Secondary | ICD-10-CM | POA: Insufficient documentation

## 2016-09-17 MED ORDER — IOPAMIDOL (ISOVUE-370) INJECTION 76%
75.0000 mL | Freq: Once | INTRAVENOUS | Status: AC | PRN
  Administered 2016-09-17: 75 mL via INTRAVENOUS

## 2016-09-21 ENCOUNTER — Other Ambulatory Visit: Payer: Self-pay | Admitting: Family Medicine

## 2016-09-21 ENCOUNTER — Encounter: Payer: Self-pay | Admitting: Family Medicine

## 2016-09-21 DIAGNOSIS — I313 Pericardial effusion (noninflammatory): Secondary | ICD-10-CM | POA: Insufficient documentation

## 2016-09-21 DIAGNOSIS — R188 Other ascites: Secondary | ICD-10-CM

## 2016-09-21 DIAGNOSIS — I3139 Other pericardial effusion (noninflammatory): Secondary | ICD-10-CM

## 2016-09-21 HISTORY — DX: Pericardial effusion (noninflammatory): I31.3

## 2016-09-21 HISTORY — DX: Other pericardial effusion (noninflammatory): I31.39

## 2016-09-21 NOTE — Progress Notes (Signed)
Please let pt know that I've sent him a MyChart message about his CT scan; the lung spot was nothing to worry about He has a small amount of fluid in the lining around his heart, so I'd like to get an echocardiogram and have him see a heart doctor to make sure his heart is squeezing well He has fatty liver, so work on weight loss and healthy eating

## 2016-09-21 NOTE — Assessment & Plan Note (Signed)
Refer to cardiology, get echo

## 2016-10-05 ENCOUNTER — Encounter: Payer: Self-pay | Admitting: Family Medicine

## 2016-10-05 ENCOUNTER — Other Ambulatory Visit: Payer: Self-pay | Admitting: Family Medicine

## 2016-10-05 DIAGNOSIS — I3139 Other pericardial effusion (noninflammatory): Secondary | ICD-10-CM

## 2016-10-05 DIAGNOSIS — I313 Pericardial effusion (noninflammatory): Secondary | ICD-10-CM

## 2016-10-05 NOTE — Progress Notes (Signed)
Diagnosis code entered erroneously for peritoneal effusion; diagnosis should read "pericardial effusion" Chart and problem list update Patient has an appointment with Dr. Alvino ChapelIngal upcoming

## 2016-10-12 ENCOUNTER — Other Ambulatory Visit: Payer: Self-pay

## 2016-10-12 ENCOUNTER — Ambulatory Visit (INDEPENDENT_AMBULATORY_CARE_PROVIDER_SITE_OTHER): Payer: 59

## 2016-10-12 DIAGNOSIS — I3139 Other pericardial effusion (noninflammatory): Secondary | ICD-10-CM

## 2016-10-12 DIAGNOSIS — I313 Pericardial effusion (noninflammatory): Secondary | ICD-10-CM

## 2016-10-19 ENCOUNTER — Ambulatory Visit: Payer: 59 | Admitting: Cardiology

## 2016-10-23 ENCOUNTER — Ambulatory Visit (INDEPENDENT_AMBULATORY_CARE_PROVIDER_SITE_OTHER): Payer: 59 | Admitting: Cardiology

## 2016-10-23 ENCOUNTER — Ambulatory Visit: Payer: 59 | Admitting: Cardiology

## 2016-10-23 ENCOUNTER — Encounter: Payer: Self-pay | Admitting: Cardiology

## 2016-10-23 VITALS — BP 148/106 | HR 86 | Ht 65.0 in | Wt 265.8 lb

## 2016-10-23 DIAGNOSIS — I4510 Unspecified right bundle-branch block: Secondary | ICD-10-CM | POA: Diagnosis not present

## 2016-10-23 DIAGNOSIS — IMO0001 Reserved for inherently not codable concepts without codable children: Secondary | ICD-10-CM

## 2016-10-23 DIAGNOSIS — E6609 Other obesity due to excess calories: Secondary | ICD-10-CM | POA: Diagnosis not present

## 2016-10-23 DIAGNOSIS — Z6841 Body Mass Index (BMI) 40.0 and over, adult: Secondary | ICD-10-CM | POA: Diagnosis not present

## 2016-10-23 DIAGNOSIS — I10 Essential (primary) hypertension: Secondary | ICD-10-CM

## 2016-10-23 NOTE — Progress Notes (Signed)
Cardiology Office Note   Date:  10/23/2016   ID:  Kevin Marshall, DOB 03/02/1961, MRN [id number]  Referring Doctor:  Baruch Gouty, MD   Cardiologist:   Almond Lint, MD   Reason for consultation:  Chief Complaint  Patient presents with  . other    New Patient. Referred by Dr.Lada for pericardial effusion. Pt states he feels well. Reviewed meds with pt verbally.      History of Present Illness: Kevin Marshall is a 56 y.o. male who presents for Evaluation for pericardial effusion. This was noted on CT November 2017 when patient presented with possible pneumonia. Echocardiogram recently does not reveal any evidence of pericardial effusion.  Patient denies chest pain and shortness of breath. He walks a lot for his work and may get to 7000-10,000 steps in a day. No chest pain or shortness of breath. No PND, orthopnea, edema. No loss of consciousness   ROS:  Please see the history of present illness. Aside from mentioned under HPI, all other systems are reviewed and negative.     Past Medical History:  Diagnosis Date  . Allergy    history of as a child, did take shots  . Blood transfusion without reported diagnosis   . Diverticulitis 2013   patient was hospitalized for over a week for this  . Food allergy 02/21/2016  . GERD (gastroesophageal reflux disease)   . Hypertension    borderline history of  . Pericardial effusion 09/21/2016  . Sleep apnea     Past Surgical History:  Procedure Laterality Date  . ACHILLES TENDON SURGERY    . HERNIA REPAIR     3     reports that he has never smoked. He has never used smokeless tobacco. He reports that he drinks about 1.2 oz of alcohol per week . He reports that he does not use drugs.   family history includes Diabetes in his mother; Hypertension in his father.   Outpatient Medications Prior to Visit  Medication Sig Dispense Refill  . amLODipine-benazepril (LOTREL) 5-20 MG capsule Take 1 capsule by mouth daily. 90 capsule 1  .  aspirin EC 81 MG tablet Take 1 tablet (81 mg total) by mouth daily. 30 tablet 11  . benzonatate (TESSALON PERLES) 100 MG capsule Take 1 capsule (100 mg total) by mouth every 8 (eight) hours as needed for cough. 30 capsule 0  . buPROPion (WELLBUTRIN XL) 150 MG 24 hr tablet Take 1 tablet (150 mg total) by mouth daily. (Patient taking differently: Take 150 mg by mouth daily as needed. ) 90 tablet 3  . clotrimazole-betamethasone (LOTRISONE) lotion Apply topically 2 (two) times daily as needed. 30 mL 1  . fluticasone (FLONASE) 50 MCG/ACT nasal spray Place 2 sprays into both nostrils daily.    Marland Kitchen levocetirizine (XYZAL) 5 MG tablet Take 5 mg by mouth daily.    Marland Kitchen omeprazole (PRILOSEC) 20 MG capsule Take 1 capsule (20 mg total) by mouth daily. (Patient taking differently: Take 40 mg by mouth daily. ) 30 capsule 0  . traZODone (DESYREL) 100 MG tablet Take 1 tablet (100 mg total) by mouth at bedtime as needed for sleep. 90 tablet 2  . traMADol (ULTRAM) 50 MG tablet Take 1 tablet (50 mg total) by mouth every 6 (six) hours as needed. (Patient not taking: Reported on 10/23/2016) 20 tablet 1   No facility-administered medications prior to visit.      Allergies: Patient has no known allergies.    PHYSICAL EXAM: VS:  BP (!) 148/106 (BP Location: Right Arm, Patient Position: Sitting, Cuff Size: Large)   Pulse 86   Ht 5\' 5"  (1.651 m)   Wt 265 lb 12 oz (120.5 kg)   BMI 44.22 kg/m  , Body mass index is 44.22 kg/m. Wt Readings from Last 3 Encounters:  10/23/16 265 lb 12 oz (120.5 kg)  08/22/16 256 lb (116.1 kg)  08/02/16 260 lb 7 oz (118.1 kg)    GENERAL:  well developed, well nourished, obese, not in acute distress HEENT: normocephalic, pink conjunctivae, anicteric sclerae, no xanthelasma, normal dentition, oropharynx clear NECK:  no neck vein engorgement, JVP normal, no hepatojugular reflux, carotid upstroke brisk and symmetric, no bruit, no thyromegaly, no lymphadenopathy LUNGS:  good respiratory effort,  clear to auscultation bilaterally CV:  PMI not displaced, no thrills, no lifts, S1 and S2 within normal limits, no palpable S3 or S4, no murmurs, no rubs, no gallops ABD:  Soft, nontender, nondistended, normoactive bowel sounds, no abdominal aortic bruit, no hepatomegaly, no splenomegaly MS: nontender back, no kyphosis, no scoliosis, no joint deformities EXT:  2+ DP/PT pulses, no edema, no varicosities, no cyanosis, no clubbing SKIN: warm, nondiaphoretic, normal turgor, no ulcers NEUROPSYCH: alert, oriented to person, place, and time, sensory/motor grossly intact, normal mood, appropriate affect  Recent Labs: 02/15/2016: BUN 14; Platelets 233; Potassium 3.7; Sodium 141 05/19/2016: Creatinine 1.1; Hemoglobin 5.8   Lipid Panel    Component Value Date/Time   CHOL 184 05/19/2016   TRIG 104 05/19/2016   HDL 41 05/19/2016   LDLCALC 122 05/19/2016     Other studies Reviewed:  EKG:  The ekg from 10/23/2016 was personally reviewed by me and it revealed sinus rhythm, 86 BPM, right bundle branch block. Left axis deviation.  Additional studies/ records that were reviewed personally reviewed by me today include: Echo 10/12/2016: Left ventricle: The cavity size was normal. There was mild   concentric hypertrophy. Systolic function was normal. The   estimated ejection fraction was in the range of 55% to 60%. Wall   motion was normal; there were no regional wall motion   abnormalities. The study is not technically sufficient to allow   evaluation of LV diastolic function. - Pericardium, extracardiac: There was no pericardial effusion.   ASSESSMENT AND PLAN:  Pericardial effusion Noted on CT, none on echocardiogram.  LVH Most likely related to high blood pressure Blood pressure control recommended  HTN Higher today than usual. BP is well controlled at home. Continue monitoring BP. Continue current medical therapy and lifestyle changes. PCP following this.  Obesity Body mass index is  44.22 kg/m.Marland Kitchen Recommend aggressive weight loss through diet and increased physical activity.   Abnormal EKG with right bundle branch block, LAD No significant symptoms reported. Recommend completing workup with pharmacologic nuclear stress is. Patient will like to think about this first.  Current medicines are reviewed at length with the patient today.  The patient does not have concerns regarding medicines.  Labs/ tests ordered today include: Orders Placed This Encounter  Procedures  . EKG 12-Lead    I had a lengthy and detailed discussion with the patient regarding diagnoses, prognosis, diagnostic options, treatment options , and side effects of medications.   I counseled the patient on importance of lifestyle modification including heart healthy diet, regular physical activity    Disposition:   FU with undersigned after tests   Signed, Almond Lint, MD  10/23/2016 4:22 PM    [facility]  This note was generated in  part with voice recognition software and I apologize for any typographical errors that were not detected and corrected.

## 2016-10-23 NOTE — Patient Instructions (Addendum)
Testing/Procedures: [facility] Exercise MYOVIEW  Your caregiver has ordered a Stress Test with nuclear imaging. The purpose of this test is to evaluate the blood supply to your heart muscle. This procedure is referred to as a "Non-Invasive Stress Test." This is because other than having an IV started in your vein, nothing is inserted or "invades" your body. Cardiac stress tests are done to find areas of poor blood flow to the heart by determining the extent of coronary artery disease (CAD).  Please note: these test may take anywhere between 2-4 hours to complete  PLEASE REPORT TO [facility] MEDICAL MALL ENTRANCE  THE VOLUNTEERS AT THE FIRST DESK WILL DIRECT YOU WHERE TO GO  Date of Procedure:_____________________________________  Arrival Time for Procedure:______________________________   PLEASE NOTIFY THE OFFICE AT LEAST 24 HOURS IN ADVANCE IF YOU ARE UNABLE TO KEEP YOUR APPOINTMENT.  [phone] AND  PLEASE NOTIFY NUCLEAR MEDICINE AT [facility] AT LEAST 24 HOURS IN ADVANCE IF YOU ARE UNABLE TO KEEP YOUR APPOINTMENT. [phone]  How to prepare for your Myoview test:  1. Do not eat or drink after midnight 2. No caffeine for 24 hours prior to test 3. No smoking 24 hours prior to test. 4. Your medication may be taken with water.  If your doctor stopped a medication because of this test, do not take that medication. 5. Ladies, please do not wear dresses.  Skirts or pants are appropriate. Please wear a short sleeve shirt. 6. No perfume, cologne or lotion. 7. Wear comfortable walking shoes. No heels!   Follow-Up: Your physician recommends that you schedule a follow-up appointment as needed with Dr. Alvino Chapel.   It was a pleasure seeing you today here in the office. Please do not hesitate to give [place] a call back if you have any further questions. [phone]  Naples Cellar RN, BSN    Cardiac Nuclear Scanning A cardiac nuclear scan is used to check your heart for problems, such as the following:  A  portion of the heart is not getting enough blood.  Part of the heart muscle has died, which happens with a heart attack.  The heart wall is not working normally.  In this test, a radioactive dye (tracer) is injected into your bloodstream. After the tracer has traveled to your heart, a scanning device is used to measure how much of the tracer is absorbed by or distributed to various areas of your heart. LET [facility] CARE PROVIDER KNOW ABOUT:  Any allergies you have.  All medicines you are taking, including vitamins, herbs, eye drops, creams, and over-the-counter medicines.  Previous problems you or members of your family have had with the use of anesthetics.  Any blood disorders you have.  Previous surgeries you have had.  Medical conditions you have.  RISKS AND COMPLICATIONS Generally, this is a safe procedure. However, as with any procedure, problems can occur. Possible problems include:   Serious chest pain.  Rapid heartbeat.  Sensation of warmth in your chest. This usually passes quickly. BEFORE THE PROCEDURE Ask your health care provider about changing or stopping your regular medicines. PROCEDURE This procedure is usually done at a hospital and takes 2-4 hours.  An IV tube is inserted into one of your veins.  Your health care provider will inject a small amount of radioactive tracer through the tube.  You will then wait for 20-40 minutes while the tracer travels through your bloodstream.  You will lie down on an exam table so images of your heart can be taken.  Images will be taken for about 15-20 minutes.  You will exercise on a treadmill or stationary bike. While you exercise, your heart activity will be monitored with an electrocardiogram (ECG), and your blood pressure will be checked.  If you are unable to exercise, you may be given a medicine to make your heart beat faster.  When blood flow to your heart has peaked, tracer will again be injected through the  IV tube.  After 20-40 minutes, you will get back on the exam table and have more images taken of your heart.  When the procedure is over, your IV tube will be removed. AFTER THE PROCEDURE  You will likely be able to leave shortly after the test. Unless your health care provider tells you otherwise, you may return to your normal schedule, including diet, activities, and medicines.  Make sure you find out how and when you will get your test results. This information is not intended to replace advice given to you by your health care provider. Make sure you discuss any questions you have with your health care provider. Document Released: 11/02/2004 Document Revised: 10/13/2013 Document Reviewed: 09/16/2013 Elsevier Interactive Patient Education  2017 [employer]

## 2016-11-02 ENCOUNTER — Other Ambulatory Visit: Payer: Self-pay | Admitting: Family Medicine

## 2016-11-02 ENCOUNTER — Telehealth: Payer: Self-pay | Admitting: Cardiology

## 2016-11-02 DIAGNOSIS — R9431 Abnormal electrocardiogram [ECG] [EKG]: Secondary | ICD-10-CM

## 2016-11-02 NOTE — Telephone Encounter (Signed)
Left voicemail message to call back so we can schedule his exercise myoview.   Pt was in to see Dr. Alvino ChapelIngal and this was ordered for abnormal EKG and patient was instructed to call when and if he wanted to schedule this.

## 2016-11-02 NOTE — Telephone Encounter (Signed)
Pt calling asking if we can call him to schedule his Myoview   Please advise.

## 2016-11-05 ENCOUNTER — Other Ambulatory Visit: Payer: Self-pay

## 2016-11-05 DIAGNOSIS — G4700 Insomnia, unspecified: Secondary | ICD-10-CM

## 2016-11-05 NOTE — Telephone Encounter (Signed)
S/w patient. Exercise myoview scheduled for 11/14/16 at 0800, patient aware to arrive at 0745. Reviewed the instructions on myoview as stated on patient's AVS from last OV with Dr Alvino ChapelIngal. He verbalized understanding.

## 2016-11-05 NOTE — Telephone Encounter (Signed)
Pt returning our call to reschedule his Myoview

## 2016-11-07 MED ORDER — FLUTICASONE PROPIONATE 50 MCG/ACT NA SUSP
2.0000 | Freq: Every day | NASAL | 3 refills | Status: DC
Start: 2016-11-07 — End: 2016-11-09

## 2016-11-07 MED ORDER — TRAZODONE HCL 100 MG PO TABS: 100.0000 mg | ORAL_TABLET | Freq: Every evening | ORAL | 3 refills | Status: DC | PRN

## 2016-11-07 NOTE — Telephone Encounter (Signed)
rx approved

## 2016-11-07 NOTE — Telephone Encounter (Signed)
approved

## 2016-11-09 MED ORDER — FLUTICASONE PROPIONATE 50 MCG/ACT NA SUSP
2.0000 | Freq: Every day | NASAL | 3 refills | Status: DC
Start: 2016-11-09 — End: 2017-11-05

## 2016-11-09 NOTE — Addendum Note (Signed)
Addended by: LADA, MELINDA P on: 11/09/2016 12:12 PM   Modules accepted: Orders

## 2016-11-09 NOTE — Telephone Encounter (Signed)
Sent to [facility]

## 2016-11-14 ENCOUNTER — Encounter
Admission: RE | Admit: 2016-11-14 | Discharge: 2016-11-14 | Disposition: A | Discharge status: Home / Self Care | Payer: 59 | Source: Ambulatory Visit | Attending: Cardiology | Admitting: Cardiology

## 2016-11-14 DIAGNOSIS — R9431 Abnormal electrocardiogram [ECG] [EKG]: Secondary | ICD-10-CM

## 2016-11-14 LAB — NM MYOCAR MULTI W/SPECT W/WALL MOTION / EF
CHL CUP RESTING HR STRESS: 78 {beats}/min
CHL CUP STRESS STAGE 1 GRADE: 0 %
CHL CUP STRESS STAGE 1 HR: 91 {beats}/min
CHL CUP STRESS STAGE 2 GRADE: 0 %
CHL CUP STRESS STAGE 3 GRADE: 10 %
CHL CUP STRESS STAGE 3 HR: 127 {beats}/min
CHL CUP STRESS STAGE 3 SPEED: 1.7 mph
CHL CUP STRESS STAGE 4 GRADE: 12 %
CHL CUP STRESS STAGE 4 HR: 160 {beats}/min
CHL CUP STRESS STAGE 4 SBP: 203 mmHg
CHL CUP STRESS STAGE 4 SPEED: 2.5 mph
CHL CUP STRESS STAGE 5 GRADE: 0 %
CHL CUP STRESS STAGE 5 HR: 120 {beats}/min
CHL CUP STRESS STAGE 6 SBP: 160 mmHg
CHL CUP STRESS STAGE 6 SPEED: 0 mph
CSEPED: 7 min
CSEPEDS: 0 s
CSEPEW: 7 METS
CSEPHR: 97 %
CSEPPBP: 203 mmHg
CSEPPHR: 160 {beats}/min
LV dias vol: 85 mL (ref 62–150)
LVSYSVOL: 40 mL
NUC STRESS TID: 0.64
Percent of predicted max HR: 95 %
SDS: 0
SRS: 6
SSS: 0
Stage 1 Speed: 0 mph
Stage 2 HR: 90 {beats}/min
Stage 2 Speed: 0 mph
Stage 3 DBP: 83 mmHg
Stage 3 SBP: 162 mmHg
Stage 4 DBP: 94 mmHg
Stage 5 Speed: 0 mph
Stage 6 DBP: 87 mmHg
Stage 6 Grade: 0 %
Stage 6 HR: 103 {beats}/min

## 2016-11-14 MED ORDER — TECHNETIUM TC 99M TETROFOSMIN IV KIT
11.7440 | PACK | Freq: Once | INTRAVENOUS | Status: AC | PRN
  Administered 2016-11-14: 11.744 via INTRAVENOUS

## 2016-11-14 MED ORDER — TECHNETIUM TC 99M TETROFOSMIN IV KIT
33.0000 | PACK | Freq: Once | INTRAVENOUS | Status: AC | PRN
  Administered 2016-11-14: 30.74 via INTRAVENOUS

## 2016-11-16 ENCOUNTER — Telehealth: Payer: Self-pay | Admitting: Cardiology

## 2016-11-16 NOTE — Telephone Encounter (Signed)
S/w patient. Results of stress test given to patient. Patient scheduled for f/u appt with Ingal on 11/20/16 at 3:30 pm, arrive 15 minutes early. Patient verbalized understanding.

## 2016-11-16 NOTE — Telephone Encounter (Signed)
Patient returning call from pam.  He is not sure if he needs to make a fu appointment or can receive results via phone.  Please call.

## 2016-11-20 ENCOUNTER — Ambulatory Visit (INDEPENDENT_AMBULATORY_CARE_PROVIDER_SITE_OTHER): Payer: 59 | Admitting: Cardiology

## 2016-11-20 ENCOUNTER — Encounter: Payer: Self-pay | Admitting: Cardiology

## 2016-11-20 VITALS — BP 161/107 | HR 86 | Ht 66.0 in | Wt 264.5 lb

## 2016-11-20 DIAGNOSIS — Z6841 Body Mass Index (BMI) 40.0 and over, adult: Secondary | ICD-10-CM | POA: Diagnosis not present

## 2016-11-20 DIAGNOSIS — E6609 Other obesity due to excess calories: Secondary | ICD-10-CM | POA: Diagnosis not present

## 2016-11-20 DIAGNOSIS — I10 Essential (primary) hypertension: Secondary | ICD-10-CM | POA: Diagnosis not present

## 2016-11-20 DIAGNOSIS — R9431 Abnormal electrocardiogram [ECG] [EKG]: Secondary | ICD-10-CM

## 2016-11-20 DIAGNOSIS — IMO0001 Reserved for inherently not codable concepts without codable children: Secondary | ICD-10-CM

## 2016-11-20 MED ORDER — AMLODIPINE BESY-BENAZEPRIL HCL 10-20 MG PO CAPS: 1.0000 | ORAL_CAPSULE | Freq: Every day | ORAL | 3 refills | Status: DC

## 2016-11-20 NOTE — Progress Notes (Signed)
Cardiology Office Note   Date:  11/20/2016   ID:  Kevin ReasonMichael Marshall, DOB 1961/04/23, MRN [id number]  Referring Doctor:  Baruch GoutyMelinda Lada, MD   Cardiologist:   Almond LintAileen Jyllian Haynie, MD   Reason for consultation:  Chief Complaint  Patient presents with  . OTHER    F/u stress test c/o flunctuating BP. Meds reviewed verbally with pt.      History of Present Illness: Kevin Marshall is a 56 y.o. male who presents for ffup after testing   Per last offcie visit, 10/23/2016, he presented for Evaluation for pericardial effusion. This was noted on CT November 2017 when patient presented with possible pneumonia. Echocardiogram recently does not reveal any evidence of pericardial effusion.  Since last visit, he has not had any CP ,SOB. He reports BP being in the 160s. No LOC.  ROS:  Please see the history of present illness. Aside from mentioned under HPI, all other systems are reviewed and negative.     Past Medical History:  Diagnosis Date  . Allergy    history of as a child, did take shots  . Blood transfusion without reported diagnosis   . Diverticulitis 2013   patient was hospitalized for over a week for this  . Food allergy 02/21/2016  . GERD (gastroesophageal reflux disease)   . Hypertension    borderline history of  . Pericardial effusion 09/21/2016  . Sleep apnea     Past Surgical History:  Procedure Laterality Date  . ACHILLES TENDON SURGERY    . HERNIA REPAIR     3     reports that he has never smoked. He has never used smokeless tobacco. He reports that he drinks about 1.2 oz of alcohol per week . He reports that he does not use drugs.   family history includes Diabetes in his mother; Hypertension in his father.   Outpatient Medications Prior to Visit  Medication Sig Dispense Refill  . amLODipine-benazepril (LOTREL) 5-20 MG capsule Take 1 capsule by mouth daily. 90 capsule 1  . aspirin EC 81 MG tablet Take 1 tablet (81 mg total) by mouth daily. 30 tablet 11  . benzonatate  (TESSALON PERLES) 100 MG capsule Take 1 capsule (100 mg total) by mouth every 8 (eight) hours as needed for cough. 30 capsule 0  . buPROPion (WELLBUTRIN XL) 150 MG 24 hr tablet Take 1 tablet (150 mg total) by mouth daily. (Patient taking differently: Take 150 mg by mouth daily as needed. ) 90 tablet 3  . clotrimazole-betamethasone (LOTRISONE) lotion Apply topically 2 (two) times daily as needed. 30 mL 1  . fluticasone (FLONASE) 50 MCG/ACT nasal spray Place 2 sprays into both nostrils daily. 48 g 3  . levocetirizine (XYZAL) 5 MG tablet Take 5 mg by mouth daily.    Marland Kitchen. omeprazole (PRILOSEC) 20 MG capsule Take 1 capsule (20 mg total) by mouth daily. (Patient taking differently: Take 40 mg by mouth daily. ) 30 capsule 0  . traZODone (DESYREL) 100 MG tablet Take 1 tablet (100 mg total) by mouth at bedtime as needed for sleep. 90 tablet 3   No facility-administered medications prior to visit.      Allergies: Patient has no known allergies.    PHYSICAL EXAM: VS:  BP (!) 161/107 (BP Location: Left Arm, Patient Position: Sitting, Cuff Size: Large)   Pulse 86   Ht 5\' 6"  (1.676 m)   Wt 264 lb 8 oz (120 kg)   BMI 42.69 kg/m  , Body mass index is  42.69 kg/m. Wt Readings from Last 3 Encounters:  11/20/16 264 lb 8 oz (120 kg)  10/23/16 265 lb 12 oz (120.5 kg)  08/22/16 256 lb (116.1 kg)    GENERAL:  well developed, well nourished, obese, not in acute distress HEENT: normocephalic, pink conjunctivae, anicteric sclerae, no xanthelasma, normal dentition, oropharynx clear NECK:  no neck vein engorgement, JVP normal, no hepatojugular reflux, carotid upstroke brisk and symmetric, no bruit, no thyromegaly, no lymphadenopathy LUNGS:  good respiratory effort, clear to auscultation bilaterally CV:  PMI not displaced, no thrills, no lifts, S1 and S2 within normal limits, no palpable S3 or S4, no murmurs, no rubs, no gallops ABD:  Soft, nontender, nondistended, normoactive bowel sounds, no abdominal aortic bruit,  no hepatomegaly, no splenomegaly MS: nontender back, no kyphosis, no scoliosis, no joint deformities EXT:  2+ DP/PT pulses, no edema, no varicosities, no cyanosis, no clubbing SKIN: warm, nondiaphoretic, normal turgor, no ulcers NEUROPSYCH: alert, oriented to person, place, and time, sensory/motor grossly intact, normal mood, appropriate affect   Recent Labs: 02/15/2016: BUN 14; Platelets 233; Potassium 3.7; Sodium 141 05/19/2016: Creatinine 1.1; Hemoglobin 5.8   Lipid Panel    Component Value Date/Time   CHOL 184 05/19/2016   TRIG 104 05/19/2016   HDL 41 05/19/2016   LDLCALC 122 05/19/2016     Other studies Reviewed:  EKG:  The ekg from 10/23/2016 was personally reviewed by me and it revealed sinus rhythm, 86 BPM, right bundle branch block. Left axis deviation.  Additional studies/ records that were reviewed personally reviewed by me today include: Echo 10/12/2016: Left ventricle: The cavity size was normal. There was mild   concentric hypertrophy. Systolic function was normal. The   estimated ejection fraction was in the range of 55% to 60%. Wall   motion was normal; there were no regional wall motion   abnormalities. The study is not technically sufficient to allow   evaluation of LV diastolic function. - Pericardium, extracardiac: There was no pericardial effusion.  Nuclear stress is 11/14/2016: Patient demonstrated impaired functional capacity. Patient achieved 7.2 METs and reached 97% of maximum predicted heart rate. There was note of hypertensive response to exercise. No chest pain. Significant artifact noted on stress EKG, but no ST segment changes suggestive of ischemia. No significant arrhythmias. Nuclear imaging revealed no evidence of ischemia/normal perfusion.  LV function was normal with an EF of 56%. Normal wall motion and thickening.  ASSESSMENT AND PLAN:  Pericardial effusion Noted on CT, none on echocardiogram.  LVH Same as per 10/23/2016: Most likely  related to high blood pressure Blood pressure control recommended  HTN Uncontrolled. Rec t oincrease to Amlodipine 10/Benazepril 20. BP log. ffup with PCP or with our office. Low sodium diet. Weight loss recommended.  Obesity Body mass index is 42.69 kg/m.Marland Kitchen Recommend aggressive weight loss through diet and increased physical activity.   Abnormal EKG with right bundle branch block, LAD No evidence of ischemia on stress test, EF wnl. Discussed at length with pt. Rec BP control as above.  Current medicines are reviewed at length with the patient today.  The patient does not have concerns regarding medicines.  Labs/ tests ordered today include: No orders of the defined types were placed in this encounter.   I had a lengthy and detailed discussion with the patient regarding diagnoses, prognosis, diagnostic options, treatment options, and side effects of medications.   I counseled the patient on importance of lifestyle modification including heart healthy diet, regular physical activity .   Disposition:  FU with undersigned after tests   Signed, Almond Lint, MD  11/20/2016 3:51 PM    [facility]  This note was generated in part with voice recognition software and I apologize for any typographical errors that were not detected and corrected.

## 2016-11-20 NOTE — Patient Instructions (Signed)
Medication Instructions:  Please increase your amlodipine-benazepril to 10-20 mg once daily Please monitor your blood pressure and keep a log of readings  Labwork: None  Testing/Procedures: None  Follow-Up: 3 months w/ Dr. Alvino ChapelIngal if you would like for her to follow you for your BP Otherwise, follow-up with your PCP  If you need a refill on your cardiac medications before your next appointment, please call your pharmacy.

## 2016-12-31 ENCOUNTER — Other Ambulatory Visit: Payer: Self-pay | Admitting: Family Medicine

## 2017-01-01 MED ORDER — RANITIDINE HCL 300 MG PO TABS: 300.0000 mg | ORAL_TABLET | Freq: Every day | ORAL | 2 refills | Status: DC

## 2017-01-01 NOTE — Telephone Encounter (Signed)
Please let pt know that I'd like to stop his PPI (omeprazole) and switch him to ranitidine Avoid triggers; lose weight, don't eat for 3 hours before bed, elevate HOB, etc. If symptoms not controlled, we'll want him to see GI

## 2017-01-01 NOTE — Telephone Encounter (Signed)
Left detailed voicemail

## 2017-02-04 ENCOUNTER — Encounter: Payer: Self-pay | Admitting: Family Medicine

## 2017-02-05 ENCOUNTER — Encounter: Payer: Self-pay | Admitting: Cardiology

## 2017-02-05 ENCOUNTER — Telehealth: Payer: Self-pay | Admitting: Cardiology

## 2017-02-05 NOTE — Telephone Encounter (Signed)
Spoke with patient and he states that he has enough to last until he can come in to see Dr. Alvino Chapel. Instructed him to please call if he should run out before his appointment and he verbalized understanding with no further questions.

## 2017-02-05 NOTE — Telephone Encounter (Signed)
Left voicemail message to call back regarding appointment and medication.

## 2017-02-18 ENCOUNTER — Other Ambulatory Visit: Payer: Self-pay

## 2017-02-18 ENCOUNTER — Encounter: Payer: Self-pay | Admitting: Family Medicine

## 2017-02-18 MED ORDER — RANITIDINE HCL 300 MG PO TABS: 300.0000 mg | ORAL_TABLET | Freq: Every day | ORAL | 3 refills | Status: DC

## 2017-02-18 NOTE — Telephone Encounter (Signed)
Mail order-90 days

## 2017-02-19 ENCOUNTER — Ambulatory Visit (INDEPENDENT_AMBULATORY_CARE_PROVIDER_SITE_OTHER): Payer: 59 | Admitting: Cardiology

## 2017-02-19 ENCOUNTER — Encounter: Payer: Self-pay | Admitting: Cardiology

## 2017-02-19 VITALS — BP 138/34 | HR 80 | Ht 66.0 in | Wt 266.2 lb

## 2017-02-19 DIAGNOSIS — I10 Essential (primary) hypertension: Secondary | ICD-10-CM

## 2017-02-19 DIAGNOSIS — Z6841 Body Mass Index (BMI) 40.0 and over, adult: Secondary | ICD-10-CM | POA: Diagnosis not present

## 2017-02-19 MED ORDER — AMLODIPINE BESY-BENAZEPRIL HCL 10-20 MG PO CAPS: 1.0000 | ORAL_CAPSULE | Freq: Every day | ORAL | 0 refills | Status: DC

## 2017-02-19 NOTE — Progress Notes (Signed)
Cardiology Office Note   Date:  02/19/2017   ID:  Kevin Marshall, DOB [birth year], MRN [id number]  Referring Doctor:  Baruch Gouty, MD   Cardiologist:   Almond Lint, MD   Reason for consultation:  Chief Complaint  Patient presents with  . other    3 month follow up. Patient denies chest pain and SOB. Meds reviewed verbally with patient.       History of Present Illness: Kevin Marshall is a 56 y.o. male who presents for Follow-up for hypertension  Per last offcie visit, 10/23/2016, he presented for Evaluation for pericardial effusion. This was noted on CT November 2017 when patient presented with possible pneumonia. Echocardiogram recently does not reveal any evidence of pericardial effusion.  Since last visit, patient reports that his blood pressure usually in the 120s at home. He has been compliant with his CPAP. No chest pain or shortness of breath. Has been compliant with his medications.  ROS:  Please see the history of present illness. Aside from mentioned under HPI, all other systems are reviewed and negative.    Past Medical History:  Diagnosis Date  . Allergy    history of as a child, did take shots  . Blood transfusion without reported diagnosis   . Diverticulitis 2013   patient was hospitalized for over a week for this  . Food allergy 02/21/2016  . GERD (gastroesophageal reflux disease)   . Hypertension    borderline history of  . Pericardial effusion 09/21/2016  . Sleep apnea     Past Surgical History:  Procedure Laterality Date  . ACHILLES TENDON SURGERY    . HERNIA REPAIR     3     reports that he has never smoked. He has never used smokeless tobacco. He reports that he drinks about 1.2 oz of alcohol per week . He reports that he does not use drugs.   family history includes Diabetes in his mother; Hypertension in his father.   Outpatient Medications Prior to Visit  Medication Sig Dispense Refill  . aspirin EC 81 MG tablet Take 1 tablet (81 mg total)  by mouth daily. 30 tablet 11  . buPROPion (WELLBUTRIN XL) 150 MG 24 hr tablet Take 1 tablet (150 mg total) by mouth daily. (Patient taking differently: Take 150 mg by mouth daily as needed. ) 90 tablet 3  . clotrimazole-betamethasone (LOTRISONE) lotion Apply topically 2 (two) times daily as needed. 30 mL 1  . fluticasone (FLONASE) 50 MCG/ACT nasal spray Place 2 sprays into both nostrils daily. 48 g 3  . levocetirizine (XYZAL) 5 MG tablet Take 5 mg by mouth daily.    Marland Kitchen omeprazole (PRILOSEC) 20 MG capsule Take 1 capsule (20 mg total) by mouth daily. (Patient taking differently: Take 40 mg by mouth daily. ) 30 capsule 0  . ranitidine (ZANTAC) 300 MG tablet Take 1 tablet (300 mg total) by mouth at bedtime. For heartburn/reflux 90 tablet 3  . traZODone (DESYREL) 100 MG tablet Take 1 tablet (100 mg total) by mouth at bedtime as needed for sleep. 90 tablet 3  . amLODipine-benazepril (LOTREL) 10-20 MG capsule Take 1 capsule by mouth daily. 90 capsule 3  . benzonatate (TESSALON PERLES) 100 MG capsule Take 1 capsule (100 mg total) by mouth every 8 (eight) hours as needed for cough. (Patient not taking: Reported on 02/19/2017) 30 capsule 0   No facility-administered medications prior to visit.      Allergies: Patient has no known allergies.    PHYSICAL  EXAM: VS:  BP (!) 138/34 (BP Location: Left Arm, Patient Position: Sitting, Cuff Size: Normal)   Pulse 80   Ht  (1.676 m)   Wt 266 lb 4 oz (120.8 kg)   BMI 42.97 kg/m  , Body mass index is 42.97 kg/m. Wt Readings from Last 3 Encounters:  02/19/17 266 lb 4 oz (120.8 kg)  11/20/16 264 lb 8 oz (120 kg)  10/23/16 265 lb 12 oz (120.5 kg)    GENERAL:  well developed, well nourished, obese, not in acute distress HEENT: normocephalic, pink conjunctivae, anicteric sclerae, no xanthelasma, normal dentition, oropharynx clear NECK:  no neck vein engorgement, JVP normal, no hepatojugular reflux, carotid upstroke brisk and symmetric, no bruit, no  thyromegaly, no lymphadenopathy LUNGS:  good respiratory effort, clear to auscultation bilaterally CV:  PMI not displaced, no thrills, no lifts, S1 and S2 within normal limits, no palpable S3 or S4, no murmurs, no rubs, no gallops ABD:  Soft, nontender, nondistended, normoactive bowel sounds, no abdominal aortic bruit, no hepatomegaly, no splenomegaly MS: nontender back, no kyphosis, no scoliosis, no joint deformities EXT:  2+ DP/PT pulses, no edema, no varicosities, no cyanosis, no clubbing SKIN: warm, nondiaphoretic, normal turgor, no ulcers NEUROPSYCH: alert, oriented to person, place, and time, sensory/motor grossly intact, normal mood, appropriate affect  Recent Labs: 05/19/2016: Creatinine 1.1; Hemoglobin 5.8   Lipid Panel    Component Value Date/Time   CHOL 184 05/19/2016   TRIG 104 05/19/2016   HDL 41 05/19/2016   LDLCALC 122 05/19/2016     Other studies Reviewed:  EKG:  The ekg from 10/23/2016 was personally reviewed by me and it revealed sinus rhythm, 86 BPM, right bundle branch block. Left axis deviation.  Additional studies/ records that were reviewed personally reviewed by me today include: Echo 10/12/2016: Left ventricle: The cavity size was normal. There was mild   concentric hypertrophy. Systolic function was normal. The   estimated ejection fraction was in the range of 55% to 60%. Wall   motion was normal; there were no regional wall motion   abnormalities. The study is not technically sufficient to allow   evaluation of LV diastolic function. - Pericardium, extracardiac: There was no pericardial effusion.  Nuclear stress is 11/14/2016: Patient demonstrated impaired functional capacity. Patient achieved 7.2 METs and reached 97% of maximum predicted heart rate. There was note of hypertensive response to exercise. No chest pain. Significant artifact noted on stress EKG, but no ST segment changes suggestive of ischemia. No significant arrhythmias. Nuclear imaging  revealed no evidence of ischemia/normal perfusion.  LV function was normal with an EF of 56%. Normal wall motion and thickening.  ASSESSMENT AND PLAN:   HTN Blood pressure has improved. Continue blood pressure monitoring. Continue current medication. Follow up with PCP.   Pericardial effusion Noted on CT, none on echocardiogram.  LVH Same as per 10/23/2016: Most likely related to high blood pressure Blood pressure control recommended   Obesity Body mass index is 42.97 kg/m. Recommend aggressive weight loss through diet and increased physical activity.    Abnormal EKG with right bundle branch block, LAD No evidence of ischemia and stresses. Today's EKGs and change.  Current medicines are reviewed at length with the patient today.  The patient does not have concerns regarding medicines.  Labs/ tests ordered today include:  Orders Placed This Encounter  Procedures  . EKG 12-Lead    I had a lengthy and detailed discussion with the patient regarding diagnoses, prognosis, diagnostic options, treatment options,  and side effects of medications.   I counseled the patient on importance of lifestyle modification including heart healthy diet, regular physical activity .  I spent at least 25 minutes with the patient today and more than 50% of the time was spent counseling the patient and coordinating care.       Disposition:   FU with Cardiology as needed  Signed, Almond Lint, MD  02/19/2017 12:55 PM    [facility]  This note was generated in part with voice recognition software and I apologize for any typographical errors that were not detected and corrected.

## 2017-02-19 NOTE — Patient Instructions (Signed)
Medication Instructions:  Refill sent in for 90 day supply of Lotrel. Future refills will need to be sent to your PCP.    Follow-Up: Your physician recommends that you schedule a follow-up appointment as needed.   It was a pleasure seeing you today here in the office. Please do not hesitate to give [place] a call back if you have any further questions. [phone]  Roberts Cellar RN, BSN

## 2017-04-09 ENCOUNTER — Other Ambulatory Visit: Payer: Self-pay | Admitting: *Deleted

## 2017-04-09 MED ORDER — AMLODIPINE BESY-BENAZEPRIL HCL 10-20 MG PO CAPS: 1.0000 | ORAL_CAPSULE | Freq: Every day | ORAL | 2 refills | Status: DC

## 2017-05-17 ENCOUNTER — Telehealth: Payer: Self-pay | Admitting: Family Medicine

## 2017-05-17 NOTE — Telephone Encounter (Signed)
Please ask pt to schedule an appt; he's not been seen in a while and needs labs

## 2017-05-17 NOTE — Telephone Encounter (Signed)
Left detailed voicemail to call and schedule an appt 

## 2017-05-20 NOTE — Telephone Encounter (Signed)
Left another detailed vociemail

## 2017-05-27 ENCOUNTER — Encounter: Payer: Self-pay | Admitting: Family Medicine

## 2017-05-27 ENCOUNTER — Ambulatory Visit (INDEPENDENT_AMBULATORY_CARE_PROVIDER_SITE_OTHER): Payer: 59 | Admitting: Family Medicine

## 2017-05-27 VITALS — BP 152/78 | HR 94 | Temp 98.2°F | Resp 16 | Wt 272.6 lb

## 2017-05-27 DIAGNOSIS — L237 Allergic contact dermatitis due to plants, except food: Secondary | ICD-10-CM

## 2017-05-27 DIAGNOSIS — I10 Essential (primary) hypertension: Secondary | ICD-10-CM

## 2017-05-27 MED ORDER — CLOBETASOL PROPIONATE 0.05 % EX CREA: 1.0000 "application " | TOPICAL_CREAM | Freq: Two times a day (BID) | CUTANEOUS | 0 refills | Status: AC

## 2017-05-27 MED ORDER — HYDROXYZINE HCL 25 MG PO TABS: 25.0000 mg | ORAL_TABLET | Freq: Four times a day (QID) | ORAL | 0 refills | Status: DC | PRN

## 2017-05-27 MED ORDER — LEVOCETIRIZINE DIHYDROCHLORIDE 5 MG PO TABS: 5.0000 mg | ORAL_TABLET | Freq: Every day | ORAL | 3 refills | Status: DC

## 2017-05-27 NOTE — Patient Instructions (Signed)
Try to follow the DASH guidelines Monitor your blood pressure and call if over 140/90 Use the cream on the affected rash until improved or resolved Use the pill to help prevent itching in your sleep (can cause drowsiness, so beware) Cover up doing yardwork

## 2017-05-27 NOTE — Progress Notes (Signed)
BP (!) 152/78 (BP Location: Left Arm, Cuff Size: Normal)   Pulse 94   Temp 98.2 F (36.8 C) (Oral)   Resp 16   Wt 272 lb 9.6 oz (123.7 kg)   SpO2 95%   BMI 44.00 kg/m    Subjective:    Patient ID: Kevin Marshall ReasonMichael Schnurr, male    DOB: [birth year], 56 y.o.   MRN: [id number]  HPI: Kevin Marshall ReasonMichael Emanuele is a 56 y.o. male  Chief Complaint  Patient presents with  . Insect Bite    Both legs  are itchy for about a week. Pt thinks he was bite by chriggers out in the yard. Really red and no puss but hot to the touch and pt thinks it spread.Marland Kitchen.    HPI Patient is here for an acute visit He was outside cleaning Felt like his ankles were just on fire, itching Did yard work outside without covering his legs Getting worse and worse Got home after traveling lat night Tried some calamine lotion, tamed it a little bit, just 3-4 hours, very temporary He is trying to not scratch Intensely itchy Does have [place] creeper  High BP today; did not take his BP medicine; says he has it and will take it when he gets home  [facility] 05/27/2017 08/22/2016 08/02/2016 07/16/2016 04/05/2016  Decreased Interest 0 0 0 0 0  Down, Depressed, Hopeless 0 0 0 0 0  PHQ - 2 Score 0 0 0 0 0    Relevant past medical, surgical, family and social history reviewed Past Medical History:  Diagnosis Date  . Allergy    history of as a child, did take shots  . Blood transfusion without reported diagnosis   . Diverticulitis 2013   patient was hospitalized for over a week for this  . Food allergy 02/21/2016  . GERD (gastroesophageal reflux disease)   . Hypertension    borderline history of  . Pericardial effusion 09/21/2016  . Sleep apnea    Past Surgical History:  Procedure Laterality Date  . ACHILLES TENDON SURGERY    . HERNIA REPAIR     3   Family History  Problem Relation Age of Onset  . Diabetes Mother   . Hypertension Father   . Alzheimer's disease Father   . Colon cancer Maternal Grandfather     Social History   Social History  . Marital status: Married    Spouse name: N/A  . Number of children: 4  . Years of education: N/A   Occupational History  . Scientist, physiologicalManufacturing Supervisor [employer]   Social History Main Topics  . Smoking status: Never Smoker  . Smokeless tobacco: Never Used  . Alcohol use 1.8 oz/week    2 Glasses of wine, 1 Cans of beer per week     Comment:  occasinally   . Drug use: No  . Sexual activity: Yes    Partners: Female    Birth control/ protection: None   Other Topics Concern  . Not on file   Social History Narrative  . No narrative on file    Interim medical history since last visit reviewed. Allergies and medications reviewed  Review of Systems Per HPI unless specifically indicated above     Objective:    BP (!) 152/78 (BP Location: Left Arm, Cuff Size: Normal)   Pulse 94   Temp 98.2 F (36.8 C) (Oral)   Resp 16   Wt 272 lb 9.6 oz (123.7 kg)   SpO2 95%   BMI  44.00 kg/m   Wt Readings from Last 3 Encounters:  05/27/17 272 lb 9.6 oz (123.7 kg)  02/19/17 266 lb 4 oz (120.8 kg)  11/20/16 264 lb 8 oz (120 kg)    Today's Vitals   05/27/17 1538 05/27/17 1558  BP: (!) 154/78 (!) 152/78  Pulse: 94   Resp: 16   Temp: 98.2 F (36.8 C)   TempSrc: Oral   SpO2: 95%   Weight: 272 lb 9.6 oz (123.7 kg)     Physical Exam  Constitutional: He appears well-developed and well-nourished.  Morbidly obese; weight gain 6+ pounds over lat 3 months  Cardiovascular: Normal rate.   Pulmonary/Chest: Effort normal.  Skin: Rash noted.  Mildly erythematous lesions on the anterior shins, vesicular, some linear configuration; no burrows; no drainage; few excoriations, few scabs; not dermatomal  Psychiatric: His mood appears not anxious.      Assessment & Plan:   Problem List Items Addressed This Visit      Cardiovascular and Mediastinum   Hypertension goal BP (blood pressure) < 140/90    Not to goal; patient agrees to take his medicine when he  gets home; notify me if not to goal       Other Visit Diagnoses    Allergic contact dermatitis due to plants, except food    -  Primary   explained dx; possible [place] as source; will rx antihistamine at night, topical corticosteroid; too strong for face; proper clothing next time in [place]       Follow up plan: No Follow-up on file.  An after-visit summary was printed and given to the patient at check-out.  Please see the patient instructions which may contain other information and recommendations beyond what is mentioned above in the assessment and plan.  Meds ordered this encounter  Medications  . clobetasol cream (TEMOVATE) 0.05 %    Sig: Apply 1 application topically 2 (two) times daily. Until rash has improved or resolved; too strong for face, groin, underarms    Dispense:  60 g    Refill:  0  . hydrOXYzine (ATARAX/VISTARIL) 25 MG tablet    Sig: Take 1 tablet (25 mg total) by mouth every 6 (six) hours as needed. (can cause drowsiness)    Dispense:  30 tablet    Refill:  0  . levocetirizine (XYZAL) 5 MG tablet    Sig: Take 1 tablet (5 mg total) by mouth daily.    Dispense:  90 tablet    Refill:  3    No orders of the defined types were placed in this encounter.

## 2017-05-28 ENCOUNTER — Encounter: Payer: Self-pay | Admitting: Family Medicine

## 2017-05-28 ENCOUNTER — Ambulatory Visit: Payer: 59 | Admitting: Family Medicine

## 2017-05-28 NOTE — Assessment & Plan Note (Signed)
Not to goal; patient agrees to take his medicine when he gets home; notify me if not to goal

## 2017-07-18 ENCOUNTER — Other Ambulatory Visit: Payer: Self-pay | Admitting: Family Medicine

## 2017-07-18 ENCOUNTER — Other Ambulatory Visit: Payer: Self-pay

## 2017-07-18 ENCOUNTER — Telehealth: Payer: Self-pay

## 2017-07-18 MED ORDER — AMLODIPINE BESY-BENAZEPRIL HCL 10-20 MG PO CAPS: 1.0000 | ORAL_CAPSULE | Freq: Every day | ORAL | 0 refills | Status: DC

## 2017-07-18 NOTE — Telephone Encounter (Signed)
Left detailed voicemial 

## 2017-07-18 NOTE — Telephone Encounter (Signed)
Med was prescribed more than a month ago for presumed contact dermatitis It should be cleared up by now If still having issues, appt here or refer to derm

## 2017-07-18 NOTE — Telephone Encounter (Signed)
Received fax from [facility] regarding a refill for Amlodipine-benazepril 10-20MG . Patient was last seen by Dr. Alvino Chapel 02/2017 and was told to follow up as needed. Tired to contacted the patient to let him know he needed to contact primary care provider for further refills thus we are seeing him as needed but there was no answer. LMOV for patient to call us back when he could so I could provide him with this information.

## 2017-07-18 NOTE — Telephone Encounter (Signed)
Requested Prescriptions   Signed Prescriptions Disp Refills  . amLODipine-benazepril (LOTREL) 10-20 MG capsule 90 capsule 0    Sig: Take 1 capsule by mouth daily.    Authorizing Provider: Ok Anis    Ordering User: Margrett Rud

## 2017-07-30 ENCOUNTER — Ambulatory Visit (INDEPENDENT_AMBULATORY_CARE_PROVIDER_SITE_OTHER): Payer: 59 | Admitting: Family Medicine

## 2017-07-30 ENCOUNTER — Encounter: Payer: Self-pay | Admitting: Family Medicine

## 2017-07-30 VITALS — BP 130/76 | HR 86 | Temp 98.5°F | Resp 14 | Ht 65.05 in | Wt 267.7 lb

## 2017-07-30 DIAGNOSIS — Z0000 Encounter for general adult medical examination without abnormal findings: Secondary | ICD-10-CM

## 2017-07-30 DIAGNOSIS — Z9189 Other specified personal risk factors, not elsewhere classified: Secondary | ICD-10-CM | POA: Diagnosis not present

## 2017-07-30 DIAGNOSIS — Z23 Encounter for immunization: Secondary | ICD-10-CM

## 2017-07-30 DIAGNOSIS — R7303 Prediabetes: Secondary | ICD-10-CM

## 2017-07-30 DIAGNOSIS — E66813 Obesity, class 3: Secondary | ICD-10-CM

## 2017-07-30 DIAGNOSIS — Z125 Encounter for screening for malignant neoplasm of prostate: Secondary | ICD-10-CM

## 2017-07-30 NOTE — Assessment & Plan Note (Signed)
Encouraged weight loss, see AVS; appeal for work just this year, should lose 1 pound per week, 52 pounds a year

## 2017-07-30 NOTE — Assessment & Plan Note (Signed)
Offered the PPSV-23 vaccine; next booster will be after age 56

## 2017-07-30 NOTE — Assessment & Plan Note (Signed)
USPSTF grade A and B recommendations reviewed with patient; age-appropriate recommendations, preventive care, screening tests, etc discussed and encouraged; healthy living encouraged; see AVS for patient education given to patient  

## 2017-07-30 NOTE — Assessment & Plan Note (Signed)
Check A1c today.

## 2017-07-30 NOTE — Progress Notes (Signed)
Patient ID: Kevin Marshall, male   DOB: [birth year], 56 y.o.   MRN: [id number]   Subjective:   Kevin Marshall is a 56 y.o. male here for a complete physical exam  Interim issues since last visit: a few spots of contact dermatitis he thinks; has been working in the yard; also, little dog had fleas, but has been treated now; back was hurting really bad, couldn't sleep and is better now; was taking advil He has a form to complete for obesity for his work; he is morbidly obese; could not meet their BMI requirement by late August so he wants to work on his weight and see what we can do together; he is not completely averse to bariatric surgery when asked  USPSTF grade A and B recommendations Depression:  Depression screen [facility] 2/9 07/30/2017 05/27/2017 08/22/2016 08/02/2016 07/16/2016  Decreased Interest 0 0 0 0 0  Down, Depressed, Hopeless 0 0 0 0 0  PHQ - 2 Score 0 0 0 0 0   Hypertension: BP Readings from Last 3 Encounters:  07/30/17 130/76  05/27/17 (!) 152/78  02/19/17 (!) 138/34   Obesity: lost five pounds since last visit; was waiting for weather to get better so he could walk; cutting down on the sodas; I asked if interested in seeing bariatric surgeon Wt Readings from Last 3 Encounters:  07/30/17 267 lb 11.2 oz (121.4 kg)  05/27/17 272 lb 9.6 oz (123.7 kg)  02/19/17 266 lb 4 oz (120.8 kg)   BMI Readings from Last 3 Encounters:  07/30/17 44.48 kg/m  05/27/17 44.00 kg/m  02/19/17 42.97 kg/m    Alcohol: socially Tobacco use: nonsmoker HIV, hep B, hep C:  Already had hep C testing, declined other Married STD testing and prevention (chl/gon/syphilis): not interested Lipids:  Lab Results  Component Value Date   CHOL 184 05/19/2016   Lab Results  Component Value Date   HDL 41 05/19/2016   Lab Results  Component Value Date   LDLCALC 122 05/19/2016   Lab Results  Component Value Date   TRIG 104 05/19/2016   No results found for: CHOLHDL No results found for:  LDLDIRECT Glucose:  Glucose  Date Value Ref Range Status  02/15/2016 90 65 - 99 mg/dL Final  64/40/3474 88 65 - 99 mg/dL Final    Comment:    Specimen received hemolyzed. Clinical correlation indicated.   Colorectal cancer: due 2019 Prostate cancer: next year, at age 67 No results found for: PSA Lung cancer:  nonsmoker AAA: n/a Aspirin: not taking every day (recommended daily 81 coated aspirin) Diet: doing mail order menus Exercise: trying to walk Skin cancer: no worrisome moles, large skin tag, never bothered him, fleshy soft, skin colored, he'll just watch  Immunizations: hx of pneumonia;  Past Medical History:  Diagnosis Date  . Allergy    history of as a child, did take shots  . Blood transfusion without reported diagnosis   . Diverticulitis 2013   patient was hospitalized for over a week for this  . Food allergy 02/21/2016  . GERD (gastroesophageal reflux disease)   . Hypertension    borderline history of  . Pericardial effusion 09/21/2016  . Sleep apnea    Past Surgical History:  Procedure Laterality Date  . ACHILLES TENDON SURGERY    . HERNIA REPAIR     3   Family History  Problem Relation Age of Onset  . Diabetes Mother   . Hypertension Father   . Alzheimer's disease Father   .  Other Brother        poliomyelitis  . Colon cancer Maternal Grandfather    Social History  Substance Use Topics  . Smoking status: Never Smoker  . Smokeless tobacco: Never Used  . Alcohol use 1.8 oz/week    2 Glasses of wine, 1 Cans of beer per week     Comment:  occasinally    Review of Systems  Constitutional: Negative for unexpected weight change.  Eyes: Negative for visual disturbance.  Respiratory: Negative for shortness of breath.   Cardiovascular: Positive for leg swelling (just from sock elastic). Negative for chest pain.  Gastrointestinal: Negative for blood in stool.  Genitourinary: Negative for hematuria.  Skin: Positive for rash.  Psychiatric/Behavioral:  Negative for dysphoric mood.    Objective:   Vitals:   07/30/17 1123  BP: 130/76  Pulse: 86  Resp: 14  Temp: 98.5 F (36.9 C)  TempSrc: Oral  SpO2: 97%  Weight: 267 lb 11.2 oz (121.4 kg)  Height: 5' 5.05" (1.652 m)   Body mass index is 44.48 kg/m. Wt Readings from Last 3 Encounters:  07/30/17 267 lb 11.2 oz (121.4 kg)  05/27/17 272 lb 9.6 oz (123.7 kg)  02/19/17 266 lb 4 oz (120.8 kg)   Physical Exam  Constitutional: He appears well-developed and well-nourished. No distress.  HENT:  Head: Normocephalic and atraumatic.  Nose: Nose normal.  Mouth/Throat: Oropharynx is clear and moist.  Eyes: EOM are normal. No scleral icterus.  Neck: No JVD present. No thyromegaly present.  Cardiovascular: Normal rate, regular rhythm and normal heart sounds.   Pulmonary/Chest: Effort normal and breath sounds normal. No respiratory distress. He has no wheezes. He has no rales.  Abdominal: Soft. Bowel sounds are normal. He exhibits no distension. There is no tenderness. There is no guarding.  Musculoskeletal: Normal range of motion. He exhibits no edema (just line from sock elastic, no pitting edema).  Lymphadenopathy:    He has no cervical adenopathy.  Neurological: He is alert. He displays normal reflexes. He exhibits normal muscle tone. Coordination normal.  Skin: Skin is warm and dry. No rash noted. He is not diaphoretic. No erythema. No pallor.  Psychiatric: He has a normal mood and affect. His behavior is normal. Judgment and thought content normal.    Assessment/Plan:   Problem List Items Addressed This Visit      Other   Prostate cancer screening    Start at age 77 now per current guidelines      Pre-diabetes    Check A1c today      Relevant Orders   Hemoglobin A1c   Pneumococcal vaccination indicated    Offered the PPSV-23 vaccine; next booster will be after age 56      Relevant Orders   Pneumococcal polysaccharide vaccine 23-valent greater than or equal to 2yo  subcutaneous/IM (Completed)   Obesity, Class III, BMI 40-49.9 (morbid obesity) (HCC)    Encouraged weight loss, see AVS; appeal for work just this year, should lose 1 pound per week, 52 pounds a year      Annual physical exam    USPSTF grade A and B recommendations reviewed with patient; age-appropriate recommendations, preventive care, screening tests, etc discussed and encouraged; healthy living encouraged; see AVS for patient education given to patient       Relevant Orders   Comprehensive metabolic panel   CBC with Differential/Platelet   Lipid panel   TSH    Other Visit Diagnoses    Needs flu shot    -  Primary   Relevant Orders   Flu Vaccine QUAD 6+ mos PF IM (Fluarix Quad PF) (Completed)      No orders of the defined types were placed in this encounter.  Orders Placed This Encounter  Procedures  . Flu Vaccine QUAD 6+ mos PF IM (Fluarix Quad PF)  . Pneumococcal polysaccharide vaccine 23-valent greater than or equal to 2yo subcutaneous/IM  . Comprehensive metabolic panel    Order Specific Question:   Has the patient fasted?    Answer:   Yes  . CBC with Differential/Platelet  . Hemoglobin A1c  . Lipid panel    Order Specific Question:   Has the patient fasted?    Answer:   Yes  . TSH    Follow up plan: Return in about 20 weeks (around 12/17/2017) for weight loss management; 1 year for next physical.  An After Visit Summary was printed and given to the patient.

## 2017-07-30 NOTE — Assessment & Plan Note (Signed)
Start at age 55 now per current guidelines

## 2017-07-30 NOTE — Patient Instructions (Addendum)
Check out the information at familydoctor.org entitled "Nutrition for Weight Loss: What You Need to Know about Fad Diets" Try to lose between 1-2 pounds per week by taking in fewer calories and burning off more calories You can succeed by limiting portions, limiting foods dense in calories and fat, becoming more active, and drinking 8 glasses of water a day (64 ounces) Don't skip meals, especially breakfast, as skipping meals may alter your metabolism Do not use over-the-counter weight loss pills or gimmicks that claim rapid weight loss A healthy BMI (or body mass index) is between 18.5 and 24.9 You can calculate your ideal BMI at the NIH website [employer]  Our next goal for you is to get your weight down to 243 pounds; that will get you out of the morbid obesity category To reach a BMI of 34.9, the target weight will be 213 pounds To reach the category for being overweight, our target will be 182 pounds or less  We should get you out of the morbid obesity category is 12-24 weeks  You have received the Pneumovax vaccine (PPSV-23) and you will need another booster of this after you turn 54 years old per current ACIP guidelines I do recommend yearly flu shots; for individuals who don't want flu shots, try to practice excellent hand hygiene, and avoid nursing homes, day cares, and hospitals during peak flu season; taking additional vitamin C daily during flu/cold season may help boost your immune system too  You may return in at least one month to have your shingles vaccine (Shingrix) Health Maintenance, Male A healthy lifestyle and preventive care is important for your health and wellness. Ask your health care provider about what schedule of regular examinations is right for you. What should I know about weight and diet? Eat a Healthy Diet  Eat plenty of vegetables, fruits, whole grains, low-fat dairy products, and lean protein.  Do not  eat a lot of foods high in solid fats, added sugars, or salt.  Maintain a Healthy Weight Regular exercise can help you achieve or maintain a healthy weight. You should:  Do at least 150 minutes of exercise each week. The exercise should increase your heart rate and make you sweat (moderate-intensity exercise).  Do strength-training exercises at least twice a week.  Watch Your Levels of Cholesterol and Blood Lipids  Have your blood tested for lipids and cholesterol every 5 years starting at 54 years of age. If you are at high risk for heart disease, you should start having your blood tested when you are 54 years old. You may need to have your cholesterol levels checked more often if: ? Your lipid or cholesterol levels are high. ? You are older than 54 years of age. ? You are at high risk for heart disease.  What should I know about cancer screening? Many types of cancers can be detected early and may often be prevented. Lung Cancer  You should be screened every year for lung cancer if: ? You are a current smoker who has smoked for at least 30 years. ? You are a former smoker who has quit within the past 15 years.  Talk to your health care provider about your screening options, when you should start screening, and how often you should be screened.  Colorectal Cancer  Routine colorectal cancer screening usually begins at 54 years of age and should be repeated every 5-10 years until you are 54 years old. You may need to be screened more often if early  forms of precancerous polyps or small growths are found. Your health care provider may recommend screening at an earlier age if you have risk factors for colon cancer.  Your health care provider may recommend using home test kits to check for hidden blood in the stool.  A small camera at the end of a tube can be used to examine your colon (sigmoidoscopy or colonoscopy). This checks for the earliest forms of colorectal cancer.  Prostate and  Testicular Cancer  Depending on your age and overall health, your health care provider may do certain tests to screen for prostate and testicular cancer.  Talk to your health care provider about any symptoms or concerns you have about testicular or prostate cancer.  Skin Cancer  Check your skin from head to toe regularly.  Tell your health care provider about any new moles or changes in moles, especially if: ? There is a change in a mole's size, shape, or color. ? You have a mole that is larger than a pencil eraser.  Always use sunscreen. Apply sunscreen liberally and repeat throughout the day.  Protect yourself by wearing long sleeves, pants, a wide-brimmed hat, and sunglasses when outside.  What should I know about heart disease, diabetes, and high blood pressure?  If you are 31-18 years of age, have your blood pressure checked every 3-5 years. If you are 20 years of age or older, have your blood pressure checked every year. You should have your blood pressure measured twice-once when you are at a hospital or clinic, and once when you are not at a hospital or clinic. Record the average of the two measurements. To check your blood pressure when you are not at a hospital or clinic, you can use: ? An automated blood pressure machine at a pharmacy. ? A home blood pressure monitor.  Talk to your health care provider about your target blood pressure.  If you are between 22-79 years old, ask your health care provider if you should take aspirin to prevent heart disease.  Have regular diabetes screenings by checking your fasting blood sugar level. ? If you are at a normal weight and have a low risk for diabetes, have this test once every three years after the age of 55. ? If you are overweight and have a high risk for diabetes, consider being tested at a younger age or more often.  A one-time screening for abdominal aortic aneurysm (AAA) by ultrasound is recommended for men aged 65-75 years  who are current or former smokers. What should I know about preventing infection? Hepatitis B If you have a higher risk for hepatitis B, you should be screened for this virus. Talk with your health care provider to find out if you are at risk for hepatitis B infection. Hepatitis C Blood testing is recommended for:  Everyone born from [birth year] through 1963.  Anyone with known risk factors for hepatitis C.  Sexually Transmitted Diseases (STDs)  You should be screened each year for STDs including gonorrhea and chlamydia if: ? You are sexually active and are younger than 54 years of age. ? You are older than 54 years of age and your health care provider tells you that you are at risk for this type of infection. ? Your sexual activity has changed since you were last screened and you are at an increased risk for chlamydia or gonorrhea. Ask your health care provider if you are at risk.  Talk with your health care provider about whether you  are at high risk of being infected with HIV. Your health care provider may recommend a prescription medicine to help prevent HIV infection.  What else can I do?  Schedule regular health, dental, and eye exams.  Stay current with your vaccines (immunizations).  Do not use any tobacco products, such as cigarettes, chewing tobacco, and e-cigarettes. If you need help quitting, ask your health care provider.  Limit alcohol intake to no more than 2 drinks per day. One drink equals 12 ounces of beer, 5 ounces of wine, or 1 ounces of hard liquor.  Do not use street drugs.  Do not share needles.  Ask your health care provider for help if you need support or information about quitting drugs.  Tell your health care provider if you often feel depressed.  Tell your health care provider if you have ever been abused or do not feel safe at home. This information is not intended to replace advice given to you by your health care provider. Make sure you discuss any  questions you have with your health care provider. Document Released: 04/05/2008 Document Revised: 06/06/2016 Document Reviewed: 07/12/2015 Elsevier Interactive Patient Education  [employer].

## 2017-08-23 ENCOUNTER — Encounter: Payer: Self-pay | Admitting: Family Medicine

## 2017-08-23 ENCOUNTER — Other Ambulatory Visit: Payer: Self-pay

## 2017-08-26 ENCOUNTER — Other Ambulatory Visit: Payer: Self-pay

## 2017-08-26 MED ORDER — AMLODIPINE BESY-BENAZEPRIL HCL 10-20 MG PO CAPS: 1.0000 | ORAL_CAPSULE | Freq: Every day | ORAL | 0 refills | Status: DC

## 2017-08-26 MED ORDER — LEVOCETIRIZINE DIHYDROCHLORIDE 5 MG PO TABS: 5.0000 mg | ORAL_TABLET | Freq: Every day | ORAL | 3 refills | Status: AC

## 2017-08-26 MED ORDER — LEVOCETIRIZINE DIHYDROCHLORIDE 5 MG PO TABS: 5.0000 mg | ORAL_TABLET | Freq: Every day | ORAL | 3 refills | Status: DC

## 2017-09-13 ENCOUNTER — Telehealth: Payer: Self-pay | Admitting: Family Medicine

## 2017-09-13 NOTE — Telephone Encounter (Signed)
Called pt informed him of the need to have labs done. Pt gave verbal understanding.

## 2017-09-13 NOTE — Telephone Encounter (Signed)
Please encourage patient to get the fasting labs done soon Thank you 

## 2017-09-17 ENCOUNTER — Telehealth: Payer: Self-pay | Admitting: Family Medicine

## 2017-09-17 NOTE — Telephone Encounter (Signed)
Patient left a detailed voice message.

## 2017-09-17 NOTE — Telephone Encounter (Signed)
Please ask the patient to come in soon for fasting labs; these were ordered on October 9th Thank you

## 2017-09-20 LAB — CBC WITH DIFFERENTIAL/PLATELET
BASOS ABS: 0 10*3/uL (ref 0.0–0.2)
Basos: 0 %
EOS (ABSOLUTE): 0.1 10*3/uL (ref 0.0–0.4)
Eos: 1 %
Hematocrit: 42.3 % (ref 37.5–51.0)
Hemoglobin: 13.7 g/dL (ref 13.0–17.7)
IMMATURE GRANS (ABS): 0 10*3/uL (ref 0.0–0.1)
IMMATURE GRANULOCYTES: 0 %
LYMPHS: 24 %
Lymphocytes Absolute: 1.9 10*3/uL (ref 0.7–3.1)
MCH: 29.2 pg (ref 26.6–33.0)
MCHC: 32.4 g/dL (ref 31.5–35.7)
MCV: 90 fL (ref 79–97)
Monocytes Absolute: 0.7 10*3/uL (ref 0.1–0.9)
Monocytes: 8 %
NEUTROS ABS: 5.5 10*3/uL (ref 1.4–7.0)
NEUTROS PCT: 67 %
PLATELETS: 253 10*3/uL (ref 150–379)
RBC: 4.69 x10E6/uL (ref 4.14–5.80)
RDW: 14.2 % (ref 12.3–15.4)
WBC: 8.2 10*3/uL (ref 3.4–10.8)

## 2017-09-20 LAB — LIPID PANEL
CHOL/HDL RATIO: 4.7 ratio (ref 0.0–5.0)
Cholesterol, Total: 183 mg/dL (ref 100–199)
HDL: 39 mg/dL — ABNORMAL LOW (ref >39)
LDL Calculated: 125 mg/dL — ABNORMAL HIGH (ref 0–99)
TRIGLYCERIDES: 96 mg/dL (ref 0–149)
VLDL Cholesterol Cal: 19 mg/dL (ref 5–40)

## 2017-09-20 LAB — COMPREHENSIVE METABOLIC PANEL
A/G RATIO: 1.4 (ref 1.2–2.2)
ALK PHOS: 88 IU/L (ref 39–117)
ALT: 25 IU/L (ref 0–44)
AST: 26 IU/L (ref 0–40)
Albumin: 4.5 g/dL (ref 3.5–5.5)
BILIRUBIN TOTAL: 0.4 mg/dL (ref 0.0–1.2)
BUN/Creatinine Ratio: 10 (ref 9–20)
BUN: 11 mg/dL (ref 6–24)
CHLORIDE: 98 mmol/L (ref 96–106)
CO2: 26 mmol/L (ref 20–29)
Calcium: 9.6 mg/dL (ref 8.7–10.2)
Creatinine, Ser: 1.07 mg/dL (ref 0.76–1.27)
GFR calc Af Amer: 90 mL/min/{1.73_m2} (ref >59)
GFR calc non Af Amer: 78 mL/min/{1.73_m2} (ref >59)
Globulin, Total: 3.3 g/dL (ref 1.5–4.5)
Glucose: 95 mg/dL (ref 65–99)
POTASSIUM: 4 mmol/L (ref 3.5–5.2)
Sodium: 139 mmol/L (ref 134–144)
Total Protein: 7.8 g/dL (ref 6.0–8.5)

## 2017-09-20 LAB — HEMOGLOBIN A1C
Est. average glucose Bld gHb Est-mCnc: 120 mg/dL
HEMOGLOBIN A1C: 5.8 % — AB (ref 4.8–5.6)

## 2017-09-20 LAB — TSH: TSH: 1.08 u[IU]/mL (ref 0.450–4.500)

## 2017-09-23 ENCOUNTER — Other Ambulatory Visit: Payer: Self-pay | Admitting: Family Medicine

## 2017-09-23 NOTE — Progress Notes (Signed)
Recommended statin, note to patient to contact me if I can send that in

## 2017-10-07 ENCOUNTER — Other Ambulatory Visit: Payer: Self-pay | Admitting: Family Medicine

## 2017-10-07 DIAGNOSIS — F411 Generalized anxiety disorder: Secondary | ICD-10-CM

## 2017-11-05 ENCOUNTER — Other Ambulatory Visit: Payer: Self-pay | Admitting: Family Medicine

## 2017-11-05 NOTE — Telephone Encounter (Signed)
Rx's approved.

## 2017-11-13 ENCOUNTER — Encounter: Payer: Self-pay | Admitting: Family Medicine

## 2017-11-13 ENCOUNTER — Ambulatory Visit (INDEPENDENT_AMBULATORY_CARE_PROVIDER_SITE_OTHER): Payer: Managed Care, Other (non HMO) | Admitting: Family Medicine

## 2017-11-13 VITALS — BP 128/76 | HR 80 | Temp 98.2°F | Resp 18 | Ht 65.0 in | Wt 270.5 lb

## 2017-11-13 DIAGNOSIS — J0100 Acute maxillary sinusitis, unspecified: Secondary | ICD-10-CM

## 2017-11-13 DIAGNOSIS — R059 Cough, unspecified: Secondary | ICD-10-CM

## 2017-11-13 DIAGNOSIS — R05 Cough: Secondary | ICD-10-CM | POA: Diagnosis not present

## 2017-11-13 MED ORDER — BENZONATATE 100 MG PO CAPS: 100.0000 mg | ORAL_CAPSULE | Freq: Three times a day (TID) | ORAL | 0 refills | Status: DC | PRN

## 2017-11-13 MED ORDER — AMOXICILLIN-POT CLAVULANATE 875-125 MG PO TABS: 1.0000 | ORAL_TABLET | Freq: Two times a day (BID) | ORAL | 0 refills | Status: AC

## 2017-11-13 NOTE — Patient Instructions (Addendum)

## 2017-11-13 NOTE — Progress Notes (Signed)
Name: Kevin Marshall   MRN: [id number]    DOB: 1961/08/02   Date:11/13/2017       Progress Note  Subjective  Chief Complaint  Chief Complaint  Patient presents with  . Sinusitis    cough, congested, yellow nasal drainage    HPI  Pt presents with concern for sinus infection.  Symptoms started about 6-7 days ago - prior to this he had several days of a cough and some nasal drainage - cough improved but is still present at night, but sinus pain/pressure worsened significantly.  He is having significant sinus pain/pressure and nasal congestion; endorses headache, ear pressure.  Has had several recent sick contacts.  Denies fevers/chills, pain with ocular movement, periorbital swelling, chest pain, shortness of breath.  He notes a history of sinusitis in the past along with allergic rhinitis, and this feels very similar to prior sinusitis episodes - has had two surgical procedures in an attempt to alleviate these recurrent symptoms.  Uses flonase daily, taking Xyzal PRN - recommend taking daily while not feeling well.  Patient Active Problem List   Diagnosis Date Noted  . Pneumococcal vaccination indicated 07/30/2017  . Pericardial effusion 09/21/2016  . Lung density on x-ray 08/23/2016  . Allergic rhinitis 08/22/2016  . Pneumonia involving left lung 08/04/2016  . Food allergy 02/21/2016  . Goiter diffuse 08/05/2015  . Insomnia 07/21/2015  . 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 Annual physical exam 07/12/2015  . History of hernia repair 07/12/2015  . Onychomycosis of toenail 07/01/2015  . Eczematous dermatitis 07/01/2015  . Hypertension goal BP (blood pressure) < 140/90 06/08/2015  . GERD without esophagitis 06/08/2015  . GAD (generalized anxiety disorder) 06/08/2015  . Obstructive sleep apnea of adult 06/08/2015  . History of GI diverticular bleed 06/08/2015  . Obesity, Class III, BMI 40-49.9 (morbid obesity) (HCC) 06/08/2015  .  Pre-diabetes 06/08/2015  . Elevated LDL cholesterol level 06/08/2015  . Prostate cancer screening 06/08/2015    Social History   Tobacco Use  . Smoking status: Never Smoker  . Smokeless tobacco: Never Used  Substance Use Topics  . Alcohol use: Yes    Alcohol/week: 1.8 oz    Types: 2 Glasses of wine, 1 Cans of beer per week    Comment:  occasinally      Current Outpatient Medications:  .  amLODipine-benazepril (LOTREL) 10-20 MG capsule, Take 1 capsule by mouth daily., Disp: 90 capsule, Rfl: 1 .  aspirin EC 81 MG tablet, Take 1 tablet (81 mg total) by mouth daily., Disp: 30 tablet, Rfl: 11 .  buPROPion (WELLBUTRIN XL) 150 MG 24 hr tablet, TAKE 1 TABLET BY MOUTH  DAILY, Disp: 90 tablet, Rfl: 3 .  clobetasol cream (TEMOVATE) 0.05 %, Apply 1 application topically 2 (two) times daily. Until rash has improved or resolved; too strong for face, groin, underarms, Disp: 60 g, Rfl: 0 .  fluticasone (FLONASE) 50 MCG/ACT nasal spray, USE 2 SPRAYS IN EACH  NOSTRIL DAILY, Disp: 48 g, Rfl: 3 .  levocetirizine (XYZAL) 5 MG tablet, Take 1 tablet (5 mg total) daily by mouth., Disp: 90 tablet, Rfl: 3 .  ranitidine (ZANTAC) 300 MG tablet, TAKE 1 TABLET BY MOUTH AT  BEDTIME FOR  HEARTBURN/REFLUX, Disp: 90 tablet, Rfl: 3 .  traZODone (DESYREL) 100 MG tablet, Take 1 tablet (100 mg total) by mouth at bedtime as needed for sleep., Disp: 90 tablet, Rfl: 3  No Known Allergies  ROS  Ten systems reviewed and  is negative except as mentioned in HPI  Objective  Vitals:   11/13/17 1039  BP: 128/76  Pulse: 80  Resp: 18  Temp: 98.2 F (36.8 C)  TempSrc: Oral  SpO2: 96%  Weight: 270 lb 8 oz (122.7 kg)  Height: 5\' 5"  (1.651 m)   Body mass index is 45.01 kg/m.  Nursing Note and Vital Signs reviewed.  Physical Exam  Constitutional: Patient appears well-developed and well-nourished. Obese No distress.  HEENT: head atraumatic, normocephalic, pupils equal and reactive to light, EOM's intact, TM's without  erythema or bulging, +maxillary sinus tenderness; no frontal sinus tenderness , neck supple with mild submandibular lymphadenopathy - more prominent on the RIGHT, oropharynx pink and moist without exudate Cardiovascular: Normal rate, regular rhythm, S1/S2 present.  No murmur or rub heard. No BLE edema. Pulmonary/Chest: Effort normal and breath sounds clear. No respiratory distress or retractions. Psychiatric: Patient has a normal mood and affect. behavior is normal. Judgment and thought content normal.  No results found for this or any previous visit (from the past 72 hour(s)).  Assessment & Plan  1. Acute non-recurrent maxillary sinusitis - Continue Flonase, and take Xyzal daily. - amoxicillin-clavulanate (AUGMENTIN) 875-125 MG tablet; Take 1 tablet by mouth 2 (two) times daily for 10 days.  Dispense: 20 tablet; Refill: 0  2. Cough - benzonatate (TESSALON) 100 MG capsule; Take 1-2 capsules (100-200 mg total) by mouth 3 (three) times daily as needed for cough.  Dispense: 30 capsule; Refill: 0 - Will call back if Tessalon not working - and we will consider alternative cough medication.  -Red flags and when to present for emergency care or RTC including fever >101.58F, chest pain, shortness of breath, new/worsening/un-resolving symptoms, periorbital swelling, or pain with ocular movement, reviewed with patient at time of visit. Follow up and care instructions discussed and provided in AVS.

## 2017-11-25 ENCOUNTER — Other Ambulatory Visit: Payer: Self-pay | Admitting: Family Medicine

## 2017-11-25 DIAGNOSIS — G4700 Insomnia, unspecified: Secondary | ICD-10-CM

## 2017-12-09 NOTE — Progress Notes (Signed)
Closing out lab/order note open since:  09/2016

## 2017-12-09 NOTE — Progress Notes (Signed)
Closing out old abstract note from 2018

## 2017-12-12 ENCOUNTER — Encounter: Payer: Self-pay | Admitting: Family Medicine

## 2017-12-12 ENCOUNTER — Ambulatory Visit: Payer: Managed Care, Other (non HMO) | Admitting: Family Medicine

## 2017-12-12 VITALS — BP 128/82 | HR 91 | Temp 98.4°F | Resp 18 | Ht 65.0 in | Wt 266.0 lb

## 2017-12-12 DIAGNOSIS — G4733 Obstructive sleep apnea (adult) (pediatric): Secondary | ICD-10-CM

## 2017-12-12 NOTE — Addendum Note (Signed)
Addended by: Doren CustardBOYCE, EMILY E on: 12/12/2017 04:04 PM   Modules accepted: Level of Service

## 2017-12-12 NOTE — Progress Notes (Signed)
Name: Kevin Marshall   MRN: [id number]    DOB: Sep 05, 1961   Date:12/12/2017       Progress Note  Subjective  Chief Complaint  Chief Complaint  Patient presents with  . Sleep Apnea    discuss sleep apnea/ need supplies    HPI  Pt presents to request a new order for CPAP mask - he uses the Resmed Nasal Mask and has tried to repair it on his own without success.  We will provide new Rx today.  Mask broke about 1/5 weeks ago.  He notes decreased sleep quality without mask, increase is daytime fatigue.  Denies shortness of breath or chest pain.  Patient Active Problem List   Diagnosis Date Noted  . Pneumococcal vaccination indicated 07/30/2017  . Pericardial effusion 09/21/2016  . Lung density on x-ray 08/23/2016  . Allergic rhinitis 08/22/2016  . Pneumonia involving left lung 08/04/2016  . Food allergy 02/21/2016  . Goiter diffuse 08/05/2015  . Insomnia 07/21/2015  . 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 Annual physical exam 07/12/2015  . History of hernia repair 07/12/2015  . Onychomycosis of toenail 07/01/2015  . Eczematous dermatitis 07/01/2015  . Hypertension goal BP (blood pressure) < 140/90 06/08/2015  . GERD without esophagitis 06/08/2015  . GAD (generalized anxiety disorder) 06/08/2015  . Obstructive sleep apnea of adult 06/08/2015  . History of GI diverticular bleed 06/08/2015  . Obesity, Class III, BMI 40-49.9 (morbid obesity) (HCC) 06/08/2015  . Pre-diabetes 06/08/2015  . Elevated LDL cholesterol level 06/08/2015  . Prostate cancer screening 06/08/2015    Social History   Tobacco Use  . Smoking status: Never Smoker  . Smokeless tobacco: Never Used  Substance Use Topics  . Alcohol use: Yes    Alcohol/week: 1.8 oz    Types: 2 Glasses of wine, 1 Cans of beer per week    Comment:  occasinally      Current Outpatient Medications:  .  amLODipine-benazepril (LOTREL) 10-20 MG capsule, Take 1 capsule by mouth daily.,  Disp: 90 capsule, Rfl: 1 .  aspirin EC 81 MG tablet, Take 1 tablet (81 mg total) by mouth daily., Disp: 30 tablet, Rfl: 11 .  buPROPion (WELLBUTRIN XL) 150 MG 24 hr tablet, TAKE 1 TABLET BY MOUTH  DAILY, Disp: 90 tablet, Rfl: 3 .  clobetasol cream (TEMOVATE) 0.05 %, Apply 1 application topically 2 (two) times daily. Until rash has improved or resolved; too strong for face, groin, underarms, Disp: 60 g, Rfl: 0 .  fluticasone (FLONASE) 50 MCG/ACT nasal spray, USE 2 SPRAYS IN EACH  NOSTRIL DAILY, Disp: 48 g, Rfl: 3 .  levocetirizine (XYZAL) 5 MG tablet, Take 1 tablet (5 mg total) daily by mouth., Disp: 90 tablet, Rfl: 3 .  ranitidine (ZANTAC) 300 MG tablet, TAKE 1 TABLET BY MOUTH AT  BEDTIME FOR  HEARTBURN/REFLUX, Disp: 90 tablet, Rfl: 3 .  traZODone (DESYREL) 100 MG tablet, TAKE 1 TABLET BY MOUTH AT  BEDTIME AS NEEDED FOR SLEEP, Disp: 90 tablet, Rfl: 3 .  benzonatate (TESSALON) 100 MG capsule, Take 1-2 capsules (100-200 mg total) by mouth 3 (three) times daily as needed for cough. (Patient not taking: Reported on 12/12/2017), Disp: 30 capsule, Rfl: 0  No Known Allergies  ROS  Constitutional: Negative for fever or weight change.  Respiratory: Negative for cough and shortness of breath.   Cardiovascular: Negative for chest pain or palpitations.  Gastrointestinal: Negative for abdominal pain, no bowel changes.  Musculoskeletal: Negative for gait  problem or joint swelling.  Skin: Negative for rash.  Neurological: Negative for dizziness or headache.  No other specific complaints in a complete review of systems (except as listed in HPI above).  Objective  Vitals:   12/12/17 0853  BP: 128/82  Pulse: 91  Resp: 18  Temp: 98.4 F (36.9 C)  TempSrc: Oral  SpO2: 96%  Weight: 266 lb (120.7 kg)  Height: 5\' 5"  (1.651 m)   Body mass index is 44.26 kg/m.  Nursing Note and Vital Signs reviewed.  Physical Exam  Constitutional: Patient appears well-developed and well-nourished. Obese No  distress.  HEENT: head atraumatic, normocephalic Cardiovascular: Normal rate, regular rhythm, S1/S2 present.  No murmur or rub heard. No BLE edema. Pulmonary/Chest: Effort normal and breath sounds clear. No respiratory distress or retractions. Psychiatric: Patient has a normal mood and affect. behavior is normal. Judgment and thought content normal.  No results found for this or any previous visit (from the past 72 hour(s)).  Assessment & Plan  1. Obstructive sleep apnea of adult - Written script for Resmed Nasal Mask with Headgear and spare nasal pad/cushion is provided to the patient.

## 2017-12-16 ENCOUNTER — Encounter: Payer: Self-pay | Admitting: Family Medicine

## 2017-12-17 ENCOUNTER — Other Ambulatory Visit: Payer: Self-pay

## 2017-12-17 DIAGNOSIS — G4733 Obstructive sleep apnea (adult) (pediatric): Secondary | ICD-10-CM

## 2017-12-27 ENCOUNTER — Ambulatory Visit (INDEPENDENT_AMBULATORY_CARE_PROVIDER_SITE_OTHER): Payer: Managed Care, Other (non HMO) | Admitting: Internal Medicine

## 2017-12-27 ENCOUNTER — Encounter: Payer: Self-pay | Admitting: Internal Medicine

## 2017-12-27 VITALS — BP 136/86 | HR 89 | Resp 16 | Ht 65.0 in | Wt 265.0 lb

## 2017-12-27 DIAGNOSIS — G4733 Obstructive sleep apnea (adult) (pediatric): Secondary | ICD-10-CM

## 2017-12-27 NOTE — Progress Notes (Signed)
[facility]  Pulmonary Medicine Consultation      Assessment and Plan:  Obstructive sleep apnea.  -Currently on 55 year old machine, with old mask, not working well. - In order to qualify him for a new machine we will have to perform a new sleep study per insurance requirements, therefore will send for home sleep study.  Essential hypertension.  - Obstructive sleep apnea can contribute to hypertension, therefore adequate treatment of the patient's sleep apnea is an important part of hypertension management.  Orders Placed This Encounter  Procedures  . Home sleep test   Return in about 3 months (around 03/29/2018).   Date: 12/27/2017  MRN# [id number] Kevin Marshall 07-Apr-1961    Kevin ReasonMichael Cottam is a 57 y.o. old male seen in consultation for chief complaint of:    Chief Complaint  Patient presents with  . Consult    referred (Dr.Lada) for managment of OSA:    HPI:   He has a diagnosis of OSA found on sleep study on 2007. He was started on CPAP at that time, and has been doing well with it. He got a new machine in 2009 and is still on that machine. He has a mask that is a few years old it is breaking down and is no longer fitting well. He uses cpap "religiously every night". He takes it with him when he travel.   He was feeling very sleepy before he started CPAP, currently he feels he is doing well with his CPAP, he is no longer sleepy, he uses it every night.  He is having issues with his current CPAP and his mask where they are no longer sealing or blowing properly.  PMHX:   Past Medical History:  Diagnosis Date  . Allergy    history of as a child, did take shots  . Blood transfusion without reported diagnosis   . Diverticulitis 2013   patient was hospitalized for over a week for this  . Food allergy 02/21/2016  . GERD (gastroesophageal reflux disease)   . Hypertension    borderline history of  . Pericardial effusion 09/21/2016  . Sleep apnea    Surgical Hx:  Past  Surgical History:  Procedure Laterality Date  . ACHILLES TENDON SURGERY    . HERNIA REPAIR     3   Family Hx:  Family History  Problem Relation Age of Onset  . Diabetes Mother   . Hypertension Father   . Alzheimer's disease Father   . Other Brother        poliomyelitis  . Colon cancer Maternal Grandfather    Social Hx:   Social History   Tobacco Use  . Smoking status: Never Smoker  . Smokeless tobacco: Never Used  Substance Use Topics  . Alcohol use: Yes    Alcohol/week: 1.8 oz    Types: 2 Glasses of wine, 1 Cans of beer per week    Comment:  occasinally   . Drug use: No   Medication:    Current Outpatient Medications:  .  amLODipine-benazepril (LOTREL) 10-20 MG capsule, Take 1 capsule by mouth daily., Disp: 90 capsule, Rfl: 1 .  aspirin EC 81 MG tablet, Take 1 tablet (81 mg total) by mouth daily., Disp: 30 tablet, Rfl: 11 .  buPROPion (WELLBUTRIN XL) 150 MG 24 hr tablet, TAKE 1 TABLET BY MOUTH  DAILY, Disp: 90 tablet, Rfl: 3 .  clobetasol cream (TEMOVATE) 0.05 %, Apply 1 application topically 2 (two) times daily. Until rash has improved or resolved; too strong  for face, groin, underarms, Disp: 60 g, Rfl: 0 .  fluticasone (FLONASE) 50 MCG/ACT nasal spray, USE 2 SPRAYS IN EACH  NOSTRIL DAILY, Disp: 48 g, Rfl: 3 .  levocetirizine (XYZAL) 5 MG tablet, Take 1 tablet (5 mg total) daily by mouth., Disp: 90 tablet, Rfl: 3 .  ranitidine (ZANTAC) 300 MG tablet, TAKE 1 TABLET BY MOUTH AT  BEDTIME FOR  HEARTBURN/REFLUX, Disp: 90 tablet, Rfl: 3 .  traZODone (DESYREL) 100 MG tablet, TAKE 1 TABLET BY MOUTH AT  BEDTIME AS NEEDED FOR SLEEP, Disp: 90 tablet, Rfl: 3   Allergies:  Patient has no known allergies.  Review of Systems: Gen:  Denies  fever, sweats, chills HEENT: Denies blurred vision, double vision. bleeds, sore throat Cvc:  No dizziness, chest pain. Resp:   Denies cough or sputum production, shortness of breath Gi: Denies swallowing difficulty, stomach pain. Gu:  Denies  bladder incontinence, burning urine Ext:   No Joint pain, stiffness. Skin: No skin rash,  hives  Endoc:  No polyuria, polydipsia. Psych: No depression, insomnia. Other:  All other systems were reviewed with the patient and were negative other that what is mentioned in the HPI.   Physical Examination:   VS: BP 136/86 (BP Location: Left Arm, Cuff Size: Large)   Pulse 89   Resp 16   Ht 5\' 5"  (1.651 m)   Wt 265 lb (120.2 kg)   SpO2 96%   BMI 44.10 kg/m   General Appearance: No distress  Neuro:without focal findings,  speech normal,  HEENT: PERRLA, EOM intact.  Malimpatti 3.  Pulmonary: normal breath sounds, No wheezing.  CardiovascularNormal S1,S2.  No m/r/g.   Abdomen: Benign, Soft, non-tender. Renal:  No costovertebral tenderness  GU:  No performed at this time. Endoc: No evident thyromegaly, no signs of acromegaly. Skin:   warm, no rashes, no ecchymosis  Extremities: normal, no cyanosis, clubbing.  Other findings:    LABORATORY PANEL:   CBC No results for input(s): WBC, HGB, HCT, PLT in the last 168 hours. ------------------------------------------------------------------------------------------------------------------  Chemistries  No results for input(s): NA, K, CL, CO2, GLUCOSE, BUN, CREATININE, CALCIUM, MG, AST, ALT, ALKPHOS, BILITOT in the last 168 hours.  Invalid input(s): GFRCGP ------------------------------------------------------------------------------------------------------------------  Cardiac Enzymes No results for input(s): TROPONINI in the last 168 hours. ------------------------------------------------------------  RADIOLOGY:  No results found.     Thank  you for the consultation and for allowing [facility] to assist in the care of your patient. Our recommendations are noted above.  Please contact us if we can be of further service.   Wells Guiles, MD.  Board Certified in Internal Medicine, Pulmonary Medicine,  Critical Care Medicine, and Sleep Medicine.  [place] [facility] Office Number: [phone]  Santiago Glad, M.D.  Billy Fischer, M.D  12/27/2017

## 2017-12-27 NOTE — Patient Instructions (Signed)
Will send for sleep study, do this test WITHOUT CPAP.     Sleep Apnea      Sleep apnea is disorder that affects a person's sleep. A person with sleep apnea has abnormal pauses in their breathing when they sleep. It is hard for them to get a good sleep. This makes a person tired during the day. It also can lead to other physical problems. There are three types of sleep apnea. One type is when breathing stops for a short time because your airway is blocked (obstructive sleep apnea). Another type is when the brain sometimes fails to give the normal signal to breathe to the muscles that control your breathing (central sleep apnea). The third type is a combination of the other two types. HOME CARE   Take all medicine as told by your doctor.  Avoid alcohol, calming medicines (sedatives), and depressant drugs.  Try to lose weight if you are overweight. Talk to your doctor about a healthy weight goal.  Your doctor may have you use a device that helps to open your airway. It can help you get the air that you need. It is called a positive airway pressure (PAP) device.   MAKE SURE YOU:   Understand these instructions.  Will watch your condition.  Will get help right away if you are not doing well or get worse.  It may take approximately 1 month for you to get used to wearing her CPAP every night.  Be sure to work with your machine to get used to it, be patient, it may take time!

## 2018-01-01 ENCOUNTER — Encounter (INDEPENDENT_AMBULATORY_CARE_PROVIDER_SITE_OTHER): Payer: Self-pay

## 2018-01-16 ENCOUNTER — Encounter: Payer: Self-pay | Admitting: Internal Medicine

## 2018-01-16 DIAGNOSIS — G4733 Obstructive sleep apnea (adult) (pediatric): Secondary | ICD-10-CM

## 2018-01-23 ENCOUNTER — Telehealth: Payer: Self-pay | Admitting: *Deleted

## 2018-01-23 DIAGNOSIS — G4733 Obstructive sleep apnea (adult) (pediatric): Secondary | ICD-10-CM | POA: Diagnosis not present

## 2018-01-23 NOTE — Telephone Encounter (Signed)
Pt informed of results. Order placed. Nothing further needed. 

## 2018-01-23 NOTE — Telephone Encounter (Signed)
LMOM for pt to return call for sleep study results as below.   Moderate OSA w/AHI 20.7, worse in supine position of AHI 44.9.  Order pended

## 2018-03-26 ENCOUNTER — Encounter: Payer: Self-pay | Admitting: Internal Medicine

## 2018-03-27 ENCOUNTER — Ambulatory Visit: Payer: Managed Care, Other (non HMO) | Admitting: Internal Medicine

## 2018-03-27 ENCOUNTER — Encounter: Payer: Self-pay | Admitting: Internal Medicine

## 2018-03-27 VITALS — BP 134/90 | HR 83 | Resp 16 | Ht 65.0 in | Wt 273.0 lb

## 2018-03-27 DIAGNOSIS — G4733 Obstructive sleep apnea (adult) (pediatric): Secondary | ICD-10-CM

## 2018-03-27 NOTE — Progress Notes (Signed)
[facility] Pul[facility] Medicine Consultation      Assessment and Plan:  Obstructive sleep apnea.  -HST 01/16/2018; AHI 20, recommended auto CPAP 5-20. - Doing great with CPAP, will continue.   Essential hypertension.  - Obstructive sleep apnea can contribute to hypertension, therefore adequate treatment of the patient's sleep apnea is an important part of hypertension management.   Return in about 1 year (around 03/28/2019).   Date: 03/27/2018  MRN# [id number] Kevin Marshall Oct 11, 1961    Kevin ReasonMichael Alfonzo is a 57 y.o. old male seen in consultation for chief complaint of:    Chief Complaint  Patient presents with  . Sleep Apnea    pt wears cpap nightly. He is not having any issues here for f/u.    HPI:  Patient has a history of obstructive sleep apnea initially diagnosed in 2007.  Recently had issues with his machine, was therefore sent for new sleep study to qualify for a new device on 01/16/2018 which confirmed the presence of obstructive sleep apnea with an AHI of 20. He is more awake, he is no longer snoring, he finds the new set up and new mask much more comfortable.   **HST 01/16/2018; AHI 20, recommended auto CPAP 5-20. **I personally reviewed download data 30 days as of 03/26/2018.  Average usage on days used is 7 hours 33 minutes, usage greater than 4 hours is 30/30 days.  Set pressure is 5-20, median pressure is 8, maximum pressure is 12, 95th percentile pressure is 10.  Residual AHI is 0.8.  Overall this shows excellent compliance with CPAP with excellent control of obstructive sleep apnea.  Medication:    Current Outpatient Medications:  .  amLODipine-benazepril (LOTREL) 10-20 MG capsule, Take 1 capsule by mouth daily., Disp: 90 capsule, Rfl: 1 .  aspirin EC 81 MG tablet, Take 1 tablet (81 mg total) by mouth daily., Disp: 30 tablet, Rfl: 11 .  buPROPion (WELLBUTRIN XL) 150 MG 24 hr tablet, TAKE 1 TABLET BY MOUTH  DAILY, Disp: 90 tablet, Rfl: 3 .  clobetasol cream (TEMOVATE)  0.05 %, Apply 1 application topically 2 (two) times daily. Until rash has improved or resolved; too strong for face, groin, underarms, Disp: 60 g, Rfl: 0 .  fluticasone (FLONASE) 50 MCG/ACT nasal spray, USE 2 SPRAYS IN EACH  NOSTRIL DAILY, Disp: 48 g, Rfl: 3 .  levocetirizine (XYZAL) 5 MG tablet, Take 1 tablet (5 mg total) daily by mouth., Disp: 90 tablet, Rfl: 3 .  ranitidine (ZANTAC) 300 MG tablet, TAKE 1 TABLET BY MOUTH AT  BEDTIME FOR  HEARTBURN/REFLUX, Disp: 90 tablet, Rfl: 3 .  traZODone (DESYREL) 100 MG tablet, TAKE 1 TABLET BY MOUTH AT  BEDTIME AS NEEDED FOR SLEEP, Disp: 90 tablet, Rfl: 3   Allergies:  Patient has no known allergies.  Review of Systems: Gen:  Denies  fever, sweats, chills HEENT: Denies blurred vision, double vision. bleeds, sore throat Cvc:  No dizziness, chest pain. Resp:   Denies cough or sputum production, shortness of breath Gi: Denies swallowing difficulty, stomach pain. Gu:  Denies bladder incontinence, burning urine Ext:   No Joint pain, stiffness. Skin: No skin rash,  hives  Endoc:  No polyuria, polydipsia. Psych: No depression, insomnia. Other:  All other systems were reviewed with the patient and were negative other that what is mentioned in the HPI.   Physical Examination:   VS: BP 134/90 (BP Location: Left Arm, Cuff Size: Large)   Pulse 83   Resp 16   Ht 5'  5" (1.651 m)   Wt 273 lb (123.8 kg)   SpO2 94%   BMI 45.43 kg/m   General Appearance: No distress  Neuro:without focal findings,  speech normal,  HEENT: PERRLA, EOM intact.  Malimpatti 3.  Pulmonary: normal breath sounds, No wheezing.  CardiovascularNormal S1,S2.  No m/r/g.   Abdomen: Benign, Soft, non-tender. Renal:  No costovertebral tenderness  GU:  No performed at this time. Endoc: No evident thyromegaly, no signs of acromegaly. Skin:   warm, no rashes, no ecchymosis  Extremities: normal, no cyanosis, clubbing.  Other findings:    LABORATORY PANEL:   CBC No results for  input(s): WBC, HGB, HCT, PLT in the last 168 hours. ------------------------------------------------------------------------------------------------------------------  Chemistries  No results for input(s): NA, K, CL, CO2, GLUCOSE, BUN, CREATININE, CALCIUM, MG, AST, ALT, ALKPHOS, BILITOT in the last 168 hours.  Invalid input(s): GFRCGP ------------------------------------------------------------------------------------------------------------------  Cardiac Enzymes No results for input(s): TROPONINI in the last 168 hours. ------------------------------------------------------------  RADIOLOGY:  No results found.     Thank  you for the consultation and for allowing [facility] to assist in the care of your patient. Our recommendations are noted above.  Please contact us if we can be of further service.   Wells Guiles, MD.  Board Certified in Internal Medicine, Pulmonary Medicine, Critical Care Medicine, and Sleep Medicine.  [place] [facility] Office Number: [phone]  Santiago Glad, M.D.  Billy Fischer, M.D  03/27/2018

## 2018-03-27 NOTE — Patient Instructions (Signed)
Continue using CPAP every night for the whole night.  

## 2018-04-18 ENCOUNTER — Ambulatory Visit: Payer: Managed Care, Other (non HMO) | Admitting: Nurse Practitioner

## 2018-04-18 ENCOUNTER — Encounter: Payer: Self-pay | Admitting: Nurse Practitioner

## 2018-04-18 VITALS — BP 140/90 | HR 83 | Temp 98.0°F | Resp 16 | Ht 65.0 in | Wt 296.6 lb

## 2018-04-18 DIAGNOSIS — R059 Cough, unspecified: Secondary | ICD-10-CM

## 2018-04-18 DIAGNOSIS — R05 Cough: Secondary | ICD-10-CM

## 2018-04-18 DIAGNOSIS — H6121 Impacted cerumen, right ear: Secondary | ICD-10-CM | POA: Diagnosis not present

## 2018-04-18 DIAGNOSIS — J0110 Acute frontal sinusitis, unspecified: Secondary | ICD-10-CM

## 2018-04-18 DIAGNOSIS — I10 Essential (primary) hypertension: Secondary | ICD-10-CM

## 2018-04-18 DIAGNOSIS — L308 Other specified dermatitis: Secondary | ICD-10-CM

## 2018-04-18 MED ORDER — AZITHROMYCIN 250 MG PO TABS: ORAL_TABLET | ORAL | 0 refills | Status: DC

## 2018-04-18 MED ORDER — BENZONATATE 100 MG PO CAPS: 100.0000 mg | ORAL_CAPSULE | Freq: Two times a day (BID) | ORAL | 0 refills | Status: AC | PRN

## 2018-04-18 MED ORDER — HYDROCORTISONE 0.5 % EX CREA: 1.0000 "application " | TOPICAL_CREAM | Freq: Two times a day (BID) | CUTANEOUS | 0 refills | Status: AC

## 2018-04-18 NOTE — Progress Notes (Addendum)
Name: Kevin Marshall ReasonMichael Volk   MRN: [id number]    DOB: [birth year]   Date:04/18/2018       Progress Note  Subjective  Chief Complaint  Chief Complaint  Patient presents with  . Sinusitis    HPI  Patient endorses nasal congestion and chest congestion and cough on Tuesday. States started to feel wheezy. Tried musinex, tylenol sinus, tried Robitussin for cough. States makes it difficult to breath with CPAP. Sts is going to the beach next week.  Denies shob, fevers, chills, chest pain.   sts bp was checked with CPAP check and it was normal in the 120's BP Readings from Last 3 Encounters:  04/18/18 140/90  03/27/18 134/90  12/27/17 136/86     Patient Active Problem List   Diagnosis Date Noted  . Pneumococcal vaccination indicated 07/30/2017  . Pericardial effusion 09/21/2016  . Lung density on x-ray 08/23/2016  . Allergic rhinitis 08/22/2016  . Pneumonia involving left lung 08/04/2016  . Food allergy 02/21/2016  . Goiter diffuse 08/05/2015  . Insomnia 07/21/2015  . 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 Annual physical exam 07/12/2015  . History of hernia repair 07/12/2015  . Onychomycosis of toenail 07/01/2015  . Eczematous dermatitis 07/01/2015  . Hypertension goal BP (blood pressure) < 140/90 06/08/2015  . GERD without esophagitis 06/08/2015  . GAD (generalized anxiety disorder) 06/08/2015  . Obstructive sleep apnea of adult 06/08/2015  . History of GI diverticular bleed 06/08/2015  . Obesity, Class III, BMI 40-49.9 (morbid obesity) (HCC) 06/08/2015  . Pre-diabetes 06/08/2015  . Elevated LDL cholesterol level 06/08/2015  . Prostate cancer screening 06/08/2015    Past Medical History:  Diagnosis Date  . Allergy    history of as a child, did take shots  . Blood transfusion without reported diagnosis   . Diverticulitis 2013   patient was hospitalized for over a week for this  . Food allergy 02/21/2016  . GERD (gastroesophageal  reflux disease)   . Hypertension    borderline history of  . Pericardial effusion 09/21/2016  . Sleep apnea     Past Surgical History:  Procedure Laterality Date  . ACHILLES TENDON SURGERY    . HERNIA REPAIR     3    Social History   Tobacco Use  . Smoking status: Never Smoker  . Smokeless tobacco: Never Used  Substance Use Topics  . Alcohol use: Yes    Alcohol/week: 1.8 oz    Types: 2 Glasses of wine, 1 Cans of beer per week    Comment:  occasinally      Current Outpatient Medications:  .  amLODipine-benazepril (LOTREL) 10-20 MG capsule, Take 1 capsule by mouth daily., Disp: 90 capsule, Rfl: 1 .  aspirin EC 81 MG tablet, Take 1 tablet (81 mg total) by mouth daily., Disp: 30 tablet, Rfl: 11 .  buPROPion (WELLBUTRIN XL) 150 MG 24 hr tablet, TAKE 1 TABLET BY MOUTH  DAILY, Disp: 90 tablet, Rfl: 3 .  clobetasol cream (TEMOVATE) 0.05 %, Apply 1 application topically 2 (two) times daily. Until rash has improved or resolved; too strong for face, groin, underarms, Disp: 60 g, Rfl: 0 .  fluticasone (FLONASE) 50 MCG/ACT nasal spray, USE 2 SPRAYS IN EACH  NOSTRIL DAILY, Disp: 48 g, Rfl: 3 .  levocetirizine (XYZAL) 5 MG tablet, Take 1 tablet (5 mg total) daily by mouth., Disp: 90 tablet, Rfl: 3 .  ranitidine (ZANTAC) 300 MG tablet, TAKE 1 TABLET BY MOUTH AT  BEDTIME FOR  HEARTBURN/REFLUX, Disp: 90 tablet, Rfl: 3 .  traZODone (DESYREL) 100 MG tablet, TAKE 1 TABLET BY MOUTH AT  BEDTIME AS NEEDED FOR SLEEP, Disp: 90 tablet, Rfl: 3  No Known Allergies  ROS  Constitutional: Negative for fever or weight change.  Respiratory: Positive for cough and Negative shortness of breath.   Cardiovascular: Negative for chest pain or palpitations.  Gastrointestinal: Negative for abdominal pain, no bowel changes.  Musculoskeletal: Negative for gait problem or joint swelling.  Skin: Negative for rash.  Neurological: Negative for dizziness or headache.  No other specific complaints in a complete review  of systems (except as listed in HPI above).  Objective  Vitals:   04/18/18 0928 04/18/18 0947  BP: (!) 150/90 140/90  Pulse: 83   Resp: 16   Temp: 98 F (36.7 C)   TempSrc: Oral   SpO2: 96%   Weight: 296 lb 9.6 oz (134.5 kg)   Height: 5\' 5"  (1.651 m)     Body mass index is 49.36 kg/m.  Nursing Note and Vital Signs reviewed.  Physical Exam   Constitutional: Patient appears well-developed and well-nourished. Obese  No distress.  HEENT: head atraumatic, normocephalic, pupils equal and reactive to light, Right eat impacted, large amount of cerumen cleared but still some impacting ear, pt coughing; Left TM without erythema or bulging,  Endorses bilateral maxillary or frontal sinus tenderness , neck supple without lymphadenopathy, oropharynx red and moist without exudate no swelling, no nasal discharge Cardiovascular: Normal rate, regular rhythm, S1/S2 present.   Pulmonary/Chest: Effort normal and breath sounds clear. No respiratory distress or retractions. Psychiatric: Patient has a normal mood and affect. behavior is normal. Judgment and thought content normal.  No results found for this or any previous visit (from the past 72 hour(s)).  Assessment & Plan  1. Acute non-recurrent frontal sinusitis -neti pot, musinex, flonase.  - azithromycin (ZITHROMAX) 250 MG tablet; 2 pills today, and then 1 pill for the next 4 days  Dispense: 6 tablet; Refill: 0  2. Cough - azithromycin (ZITHROMAX) 250 MG tablet; 2 pills today, and then 1 pill for the next 4 days  Dispense: 6 tablet; Refill: 0 - benzonatate (TESSALON) 100 MG capsule; Take 1 capsule (100 mg total) by mouth 2 (two) times daily as needed for cough.  Dispense: 20 capsule; Refill: 0  3. Other eczema Resolved presently but wanted for face because he ran out of clobetasol which he uses occasionally- informed not to use that on face.  - hydrocortisone cream 0.5 %; Apply 1 application topically 2 (two) times daily.  Dispense: 30 g;  Refill: 0  4. Impacted cerumen of right ear - Use debrox over the counter ear drops OR colace drops in ear to soften ear wax. Take as instructed.  - Ear Lavage   5. Hypertension goal BP (blood pressure) < 140/90 sts is normal usually but elevated today due to illness will recheck at work and follow up in 2 weeks here for recheck   -Red flags and when to present for emergency care or RTC including fever >101.33F, chest pain, shortness of breath, new/worsening/un-resolving symptoms, reviewed with patient at time of visit. Follow up and care instructions discussed and provided in AVS.  ---------------------------------------- I have reviewed this encounter including the documentation in this note and/or discussed this patient with the provider, Sharyon Cable DNP AGNP-C. I am certifying that I agree with the content of this note as supervising physician. Baruch Gouty, MD [facility] 04/21/2018, 8:36 AM

## 2018-04-18 NOTE — Patient Instructions (Addendum)
- Use debrox over the counter ear drops OR colace drops in ear to soften ear wax. Take as instructed.   - Continue musinex, can use tessalon perls instead of Robitussin for cough.  - Take antibiotic as prescribed with some food on your stomach   -Blood pressure goal is under 130 (top number) / 80 ( bottom number).    DASH Eating Plan DASH stands for "Dietary Approaches to Stop Hypertension." The DASH eating plan is a healthy eating plan that has been shown to reduce high blood pressure (hypertension). It may also reduce your risk for type 2 diabetes, heart disease, and stroke. The DASH eating plan may also help with weight loss. What are tips for following this plan? General guidelines  Avoid eating more than 2,300 mg (milligrams) of salt (sodium) a day. If you have hypertension, you may need to reduce your sodium intake to 1,500 mg a day.  Limit alcohol intake to no more than 1 drink a day for nonpregnant women and 2 drinks a day for men. One drink equals 12 oz of beer, 5 oz of wine, or 1 oz of hard liquor.  Work with your health care provider to maintain a healthy body weight or to lose weight. Ask what an ideal weight is for you.  Get at least 30 minutes of exercise that causes your heart to beat faster (aerobic exercise) most days of the week. Activities may include walking, swimming, or biking.  Work with your health care provider or diet and nutrition specialist (dietitian) to adjust your eating plan to your individual calorie needs. Reading food labels  Check food labels for the amount of sodium per serving. Choose foods with less than 5 percent of the Daily Value of sodium. Generally, foods with less than 300 mg of sodium per serving fit into this eating plan.  To find whole grains, look for the word "whole" as the first word in the ingredient list. Shopping  Buy products labeled as "low-sodium" or "no salt added."  Buy fresh foods. Avoid canned foods and premade or frozen  meals. Cooking  Avoid adding salt when cooking. Use salt-free seasonings or herbs instead of table salt or sea salt. Check with your health care provider or pharmacist before using salt substitutes.  Do not fry foods. Cook foods using healthy methods such as baking, boiling, grilling, and broiling instead.  Cook with heart-healthy oils, such as olive, canola, soybean, or sunflower oil. Meal planning   Eat a balanced diet that includes: ? 5 or more servings of fruits and vegetables each day. At each meal, try to fill half of your plate with fruits and vegetables. ? Up to 6-8 servings of whole grains each day. ? Less than 6 oz of lean meat, poultry, or fish each day. A 3-oz serving of meat is about the same size as a deck of cards. One egg equals 1 oz. ? 2 servings of low-fat dairy each day. ? A serving of nuts, seeds, or beans 5 times each week. ? Heart-healthy fats. Healthy fats called Omega-3 fatty acids are found in foods such as flaxseeds and coldwater fish, like sardines, salmon, and mackerel.  Limit how much you eat of the following: ? Canned or prepackaged foods. ? Food that is high in trans fat, such as fried foods. ? Food that is high in saturated fat, such as fatty meat. ? Sweets, desserts, sugary drinks, and other foods with added sugar. ? Full-fat dairy products.  Do not salt foods  before eating.  Try to eat at least 2 vegetarian meals each week.  Eat more home-cooked food and less restaurant, buffet, and fast food.  When eating at a restaurant, ask that your food be prepared with less salt or no salt, if possible. What foods are recommended? The items listed may not be a complete list. Talk with your dietitian about what dietary choices are best for you. Grains Whole-grain or whole-wheat bread. Whole-grain or whole-wheat pasta. Brown rice. [place]. Bulgur. Whole-grain and low-sodium cereals. Pita bread. Low-fat, low-sodium crackers. Whole-wheat flour  tortillas. Vegetables Fresh or frozen vegetables (raw, steamed, roasted, or grilled). Low-sodium or reduced-sodium tomato and vegetable juice. Low-sodium or reduced-sodium tomato sauce and tomato paste. Low-sodium or reduced-sodium canned vegetables. Fruits All fresh, dried, or frozen fruit. Canned fruit in natural juice (without added sugar). Meat and other protein foods Skinless chicken or [place]. Ground chicken or [place]. Pork with fat trimmed off. Fish and seafood. Egg whites. Dried beans, peas, or lentils. Unsalted nuts, nut butters, and seeds. Unsalted canned beans. Lean cuts of beef with fat trimmed off. Low-sodium, lean deli meat. Dairy Low-fat (1%) or fat-free (skim) milk. Fat-free, low-fat, or reduced-fat cheeses. Nonfat, low-sodium ricotta or cottage cheese. Low-fat or nonfat yogurt. Low-fat, low-sodium cheese. Fats and oils Soft margarine without trans fats. Vegetable oil. Low-fat, reduced-fat, or light mayonnaise and salad dressings (reduced-sodium). Canola, safflower, olive, soybean, and sunflower oils. Avocado. Seasoning and other foods Herbs. Spices. Seasoning mixes without salt. Unsalted popcorn and pretzels. Fat-free sweets. What foods are not recommended? The items listed may not be a complete list. Talk with your dietitian about what dietary choices are best for you. Grains Baked goods made with fat, such as croissants, muffins, or some breads. Dry pasta or rice meal packs. Vegetables Creamed or fried vegetables. Vegetables in a cheese sauce. Regular canned vegetables (not low-sodium or reduced-sodium). Regular canned tomato sauce and paste (not low-sodium or reduced-sodium). Regular tomato and vegetable juice (not low-sodium or reduced-sodium). Angie Fava. Olives. Fruits Canned fruit in a light or heavy syrup. Fried fruit. Fruit in cream or butter sauce. Meat and other protein foods Fatty cuts of meat. Ribs. Fried meat. Berniece Salines. Sausage. Bologna and other processed lunch meats.  Salami. Fatback. Hotdogs. Bratwurst. Salted nuts and seeds. Canned beans with added salt. Canned or smoked fish. Whole eggs or egg yolks. Chicken or [place] with skin. Dairy Whole or 2% milk, cream, and half-and-half. Whole or full-fat cream cheese. Whole-fat or sweetened yogurt. Full-fat cheese. Nondairy creamers. Whipped toppings. Processed cheese and cheese spreads. Fats and oils Butter. Stick margarine. Lard. Shortening. Ghee. Bacon fat. Tropical oils, such as coconut, palm kernel, or palm oil. Seasoning and other foods Salted popcorn and pretzels. Onion salt, garlic salt, seasoned salt, table salt, and sea salt. Worcestershire sauce. Tartar sauce. Barbecue sauce. Teriyaki sauce. Soy sauce, including reduced-sodium. Steak sauce. Canned and packaged gravies. Fish sauce. Oyster sauce. Cocktail sauce. Horseradish that you find on the shelf. Ketchup. Mustard. Meat flavorings and tenderizers. Bouillon cubes. Hot sauce and Tabasco sauce. Premade or packaged marinades. Premade or packaged taco seasonings. Relishes. Regular salad dressings. Where to find more information:  National Heart, Lung, and [place]: https://wilson-eaton.com/  American Heart Association: www.heart.org Summary  The DASH eating plan is a healthy eating plan that has been shown to reduce high blood pressure (hypertension). It may also reduce your risk for type 2 diabetes, heart disease, and stroke.  With the DASH eating plan, you should limit salt (sodium) intake to 2,300 mg  a day. If you have hypertension, you may need to reduce your sodium intake to 1,500 mg a day.  When on the DASH eating plan, aim to eat more fresh fruits and vegetables, whole grains, lean proteins, low-fat dairy, and heart-healthy fats.  Work with your health care provider or diet and nutrition specialist (dietitian) to adjust your eating plan to your individual calorie needs. This information is not intended to replace advice given to you by your health  care provider. Make sure you discuss any questions you have with your health care provider. Document Released: 09/27/2011 Document Revised: 10/01/2016 Document Reviewed: 10/01/2016 Elsevier Interactive Patient Education  [employer].

## 2018-07-21 ENCOUNTER — Encounter: Payer: Self-pay | Admitting: *Deleted

## 2018-09-18 ENCOUNTER — Telehealth: Payer: Self-pay | Admitting: Family Medicine

## 2018-09-19 MED ORDER — AMLODIPINE BESY-BENAZEPRIL HCL 10-20 MG PO CAPS: 1.0000 | ORAL_CAPSULE | Freq: Every day | ORAL | 0 refills | Status: AC

## 2018-09-19 NOTE — Telephone Encounter (Signed)
I have not seen the patient in over a year; he has had two acute visits, but no regular visit or labs in a year Please ask him to schedule a visit with me in the next few weeks and we'll get labs at that time too I'll approve 30 day supply of meds to local pharmacy [facility]) and then we can send in mail order once we get his lab results back

## 2018-09-19 NOTE — Telephone Encounter (Signed)
Called [phone] @ 9:26 lvm informing pt that prescription has been refilled. Asked pt to return call to schedule appt for additional refills. He has the option of seeing Lanora Manislizabeth if Dr Sherie DonLada is booked

## 2019-04-03 NOTE — Progress Notes (Signed)
[facility] Consultation     Virtual Visit via video note I connected with patient on 04/06/19 at  8:45 AM EDT by telephone and verified that I am speaking with the correct person using two identifiers.   I discussed the limitations, risks, security and privacy concerns of performing an evaluation and management service by video and the availability of in person appointments. I also discussed with the patient that there may be a patient responsible charge related to this service. The patient expressed understanding and agreed to proceed. I discussed the assessment and treatment plan with the patient. The patient was provided an opportunity to ask questions and all were answered. The patient agreed with the plan and demonstrated an understanding of the instructions. Please see note below for further detail.    The patient was advised to call back or seek an in-person evaluation if the symptoms worsen or if the condition fails to improve as anticipated.  Laverle Hobby, MD    Assessment and Plan:  Obstructive sleep apnea. -HST 01/16/2018; AHI 20, recommended auto CPAP 5-20. - Doing great with CPAP, will continue.   Essential hypertension. - Obstructive sleep apnea can contribute to hypertension, therefore adequate treatment of the patient's sleep apnea is an important part of hypertension management.   Return in about 1 year (around 04/05/2020).   Date: 04/03/2019  MRN# [id number] Kevin Marshall [birth year]    Coleson Kant is a 58 y.o. old male seen in consultation for chief complaint of: sleep apnea.   HPI:  Rasheed Welty is a 58 y.o. male with a  history of obstructive sleep apnea initially diagnosed in 2007.  Recently had a repeat sleep study on 01/16/2018 which confirmed the presence of obstructive sleep apnea with an AHI of 20.  Patient has been doing very well with CPAP, is no longer snoring, he is much more awake during the day since using the CPAP.   He is using it every night for the entire night.  He washes out supplies about once per week.  **CPAP download 03/07/2019-04/05/2019>> uses greater than 4 hours 30/30 days.  Average usage on days used is 7 hours 42 minutes.  Pressure ranges 5-20.  Median pressure 6, 95th percentile pressure is 8, maximum pressure is 10.  Leaks are within normal limits.  Residual AHI 0.7.  Overall this shows excellent compliance with excellent control of obstructive sleep apnea. **HST 01/16/2018; AHI 20, recommended auto CPAP 5-20. **I personally reviewed download data 30 days as of 03/26/2018.  Average usage on days used is 7 hours 33 minutes, usage greater than 4 hours is 30/30 days.  Set pressure is 5-20, median pressure is 8, maximum pressure is 12, 95th percentile pressure is 10.  Residual AHI is 0.8.  Overall this shows excellent compliance with CPAP with excellent control of obstructive sleep apnea.  Medication:    Current Outpatient Medications:    amLODipine-benazepril (LOTREL) 10-20 MG capsule, Take 1 capsule by mouth daily., Disp: 30 capsule, Rfl: 0   aspirin EC 81 MG tablet, Take 1 tablet (81 mg total) by mouth daily., Disp: 30 tablet, Rfl: 11   benzonatate (TESSALON) 100 MG capsule, Take 1 capsule (100 mg total) by mouth 2 (two) times daily as needed for cough., Disp: 20 capsule, Rfl: 0   buPROPion (WELLBUTRIN XL) 150 MG 24 hr tablet, TAKE 1 TABLET BY MOUTH  DAILY, Disp: 90 tablet, Rfl: 3   clobetasol cream (TEMOVATE) 7.37 %, Apply 1 application topically 2 (two) times daily.  Until rash has improved or resolved; too strong for face, groin, underarms, Disp: 60 g, Rfl: 0   fluticasone (FLONASE) 50 MCG/ACT nasal spray, USE 2 SPRAYS IN EACH  NOSTRIL DAILY, Disp: 48 g, Rfl: 3   hydrocortisone cream 0.5 %, Apply 1 application topically 2 (two) times daily., Disp: 30 g, Rfl: 0   levocetirizine (XYZAL) 5 MG tablet, Take 1 tablet (5 mg total) daily by mouth., Disp: 90 tablet, Rfl: 3   ranitidine (ZANTAC) 300  MG tablet, TAKE 1 TABLET BY MOUTH AT  BEDTIME FOR  HEARTBURN/REFLUX, Disp: 90 tablet, Rfl: 3   traZODone (DESYREL) 100 MG tablet, TAKE 1 TABLET BY MOUTH AT  BEDTIME AS NEEDED FOR SLEEP, Disp: 90 tablet, Rfl: 3   Allergies:  Patient has no known allergies.  Review of Systems:  Constitutional: Feels well. Cardiovascular: Denies chest pain, exertional chest pain.  Pulmonary: Denies hemoptysis, pleuritic chest pain.   The remainder of systems were reviewed and were found to be negative other than what is documented in the HPI.      Physical Examination:  --    LABORATORY PANEL:   CBC No results for input(s): WBC, HGB, HCT, PLT in the last 168 hours. ------------------------------------------------------------------------------------------------------------------  Chemistries  No results for input(s): NA, K, CL, CO2, GLUCOSE, BUN, CREATININE, CALCIUM, MG, AST, ALT, ALKPHOS, BILITOT in the last 168 hours.  Invalid input(s): GFRCGP ------------------------------------------------------------------------------------------------------------------  Cardiac Enzymes No results for input(s): TROPONINI in the last 168 hours. ------------------------------------------------------------  RADIOLOGY:  No results found.     Thank  you for the consultation and for allowing [facility] to assist in the care of your patient. Our recommendations are noted above.  Please contact us if we can be of further service.  Wells Guileseep Nycere Presley, M.D., F.C.C.P.  Board Certified in Internal Medicine, Pulmonary Medicine, Critical Care Medicine, and Sleep Medicine.  [facility] Office Number: [phone]   04/03/2019

## 2019-04-06 ENCOUNTER — Ambulatory Visit (INDEPENDENT_AMBULATORY_CARE_PROVIDER_SITE_OTHER): Payer: Managed Care, Other (non HMO) | Admitting: Internal Medicine

## 2019-04-06 ENCOUNTER — Encounter: Payer: Self-pay | Admitting: Internal Medicine

## 2019-04-06 DIAGNOSIS — G4733 Obstructive sleep apnea (adult) (pediatric): Secondary | ICD-10-CM | POA: Diagnosis not present

## 2019-04-06 NOTE — Patient Instructions (Signed)
Continue using CPAP every night.  

## 2019-05-12 ENCOUNTER — Other Ambulatory Visit: Payer: Self-pay

## 2019-05-12 ENCOUNTER — Other Ambulatory Visit: Payer: Self-pay | Admitting: Internal Medicine

## 2019-05-12 ENCOUNTER — Ambulatory Visit
Admission: RE | Admit: 2019-05-12 | Discharge: 2019-05-12 | Disposition: A | Discharge status: Home / Self Care | Payer: Managed Care, Other (non HMO) | Source: Ambulatory Visit | Attending: Internal Medicine | Admitting: Internal Medicine

## 2019-05-12 DIAGNOSIS — J189 Pneumonia, unspecified organism: Secondary | ICD-10-CM | POA: Insufficient documentation

## 2019-05-12 IMAGING — CR CHEST - 2 VIEW
1 series · 2 of 2 positions shown · non-contrast
Comparison: [DATE]

CLINICAL DATA: Follow-up pneumonia. Shortness of breath and
heaviness.

EXAM:
CHEST - 2 VIEW

[Series 1: dg chest 2 view · 0.14mm/px · 2 of 2 slices shown]
[im 1/2]
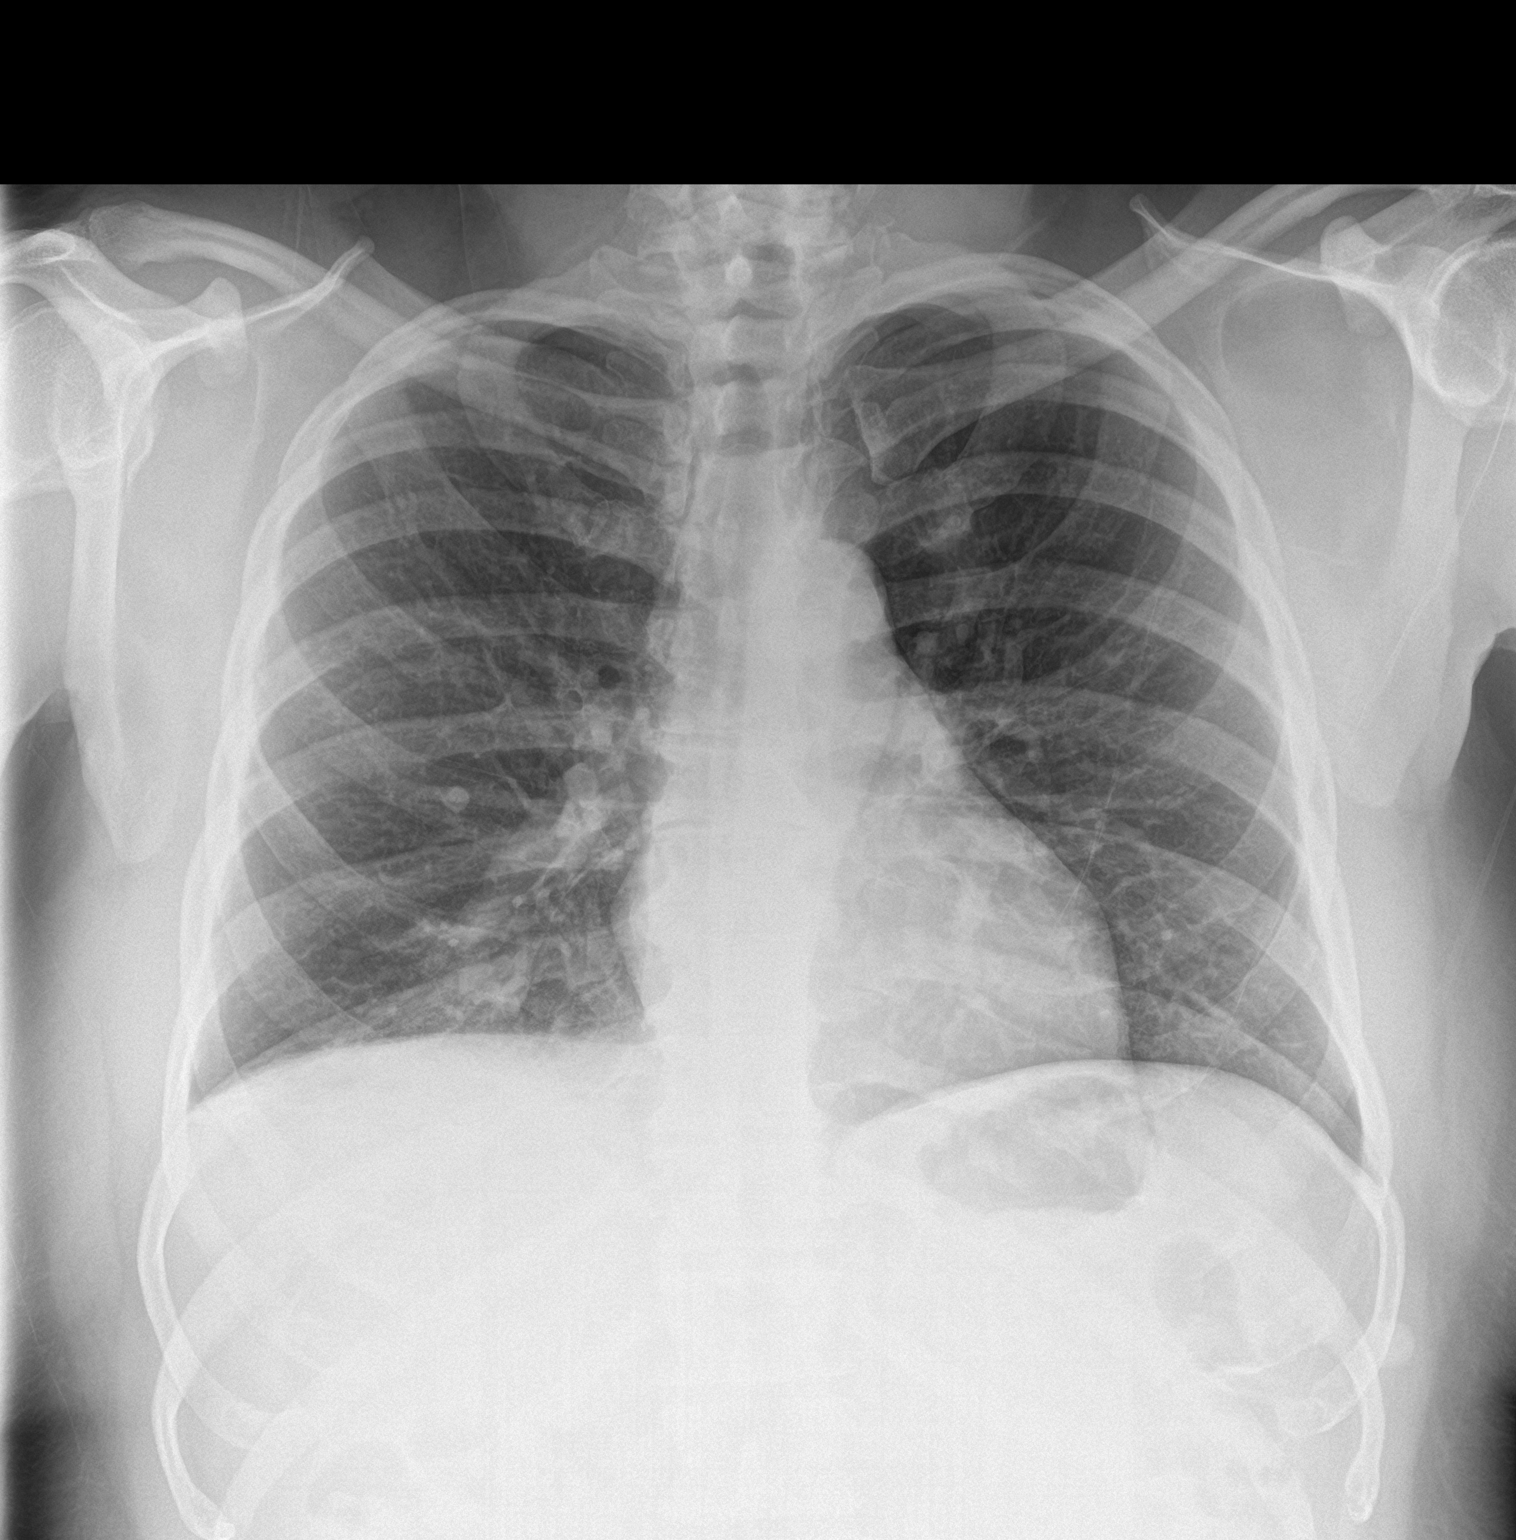
[im 2/2]
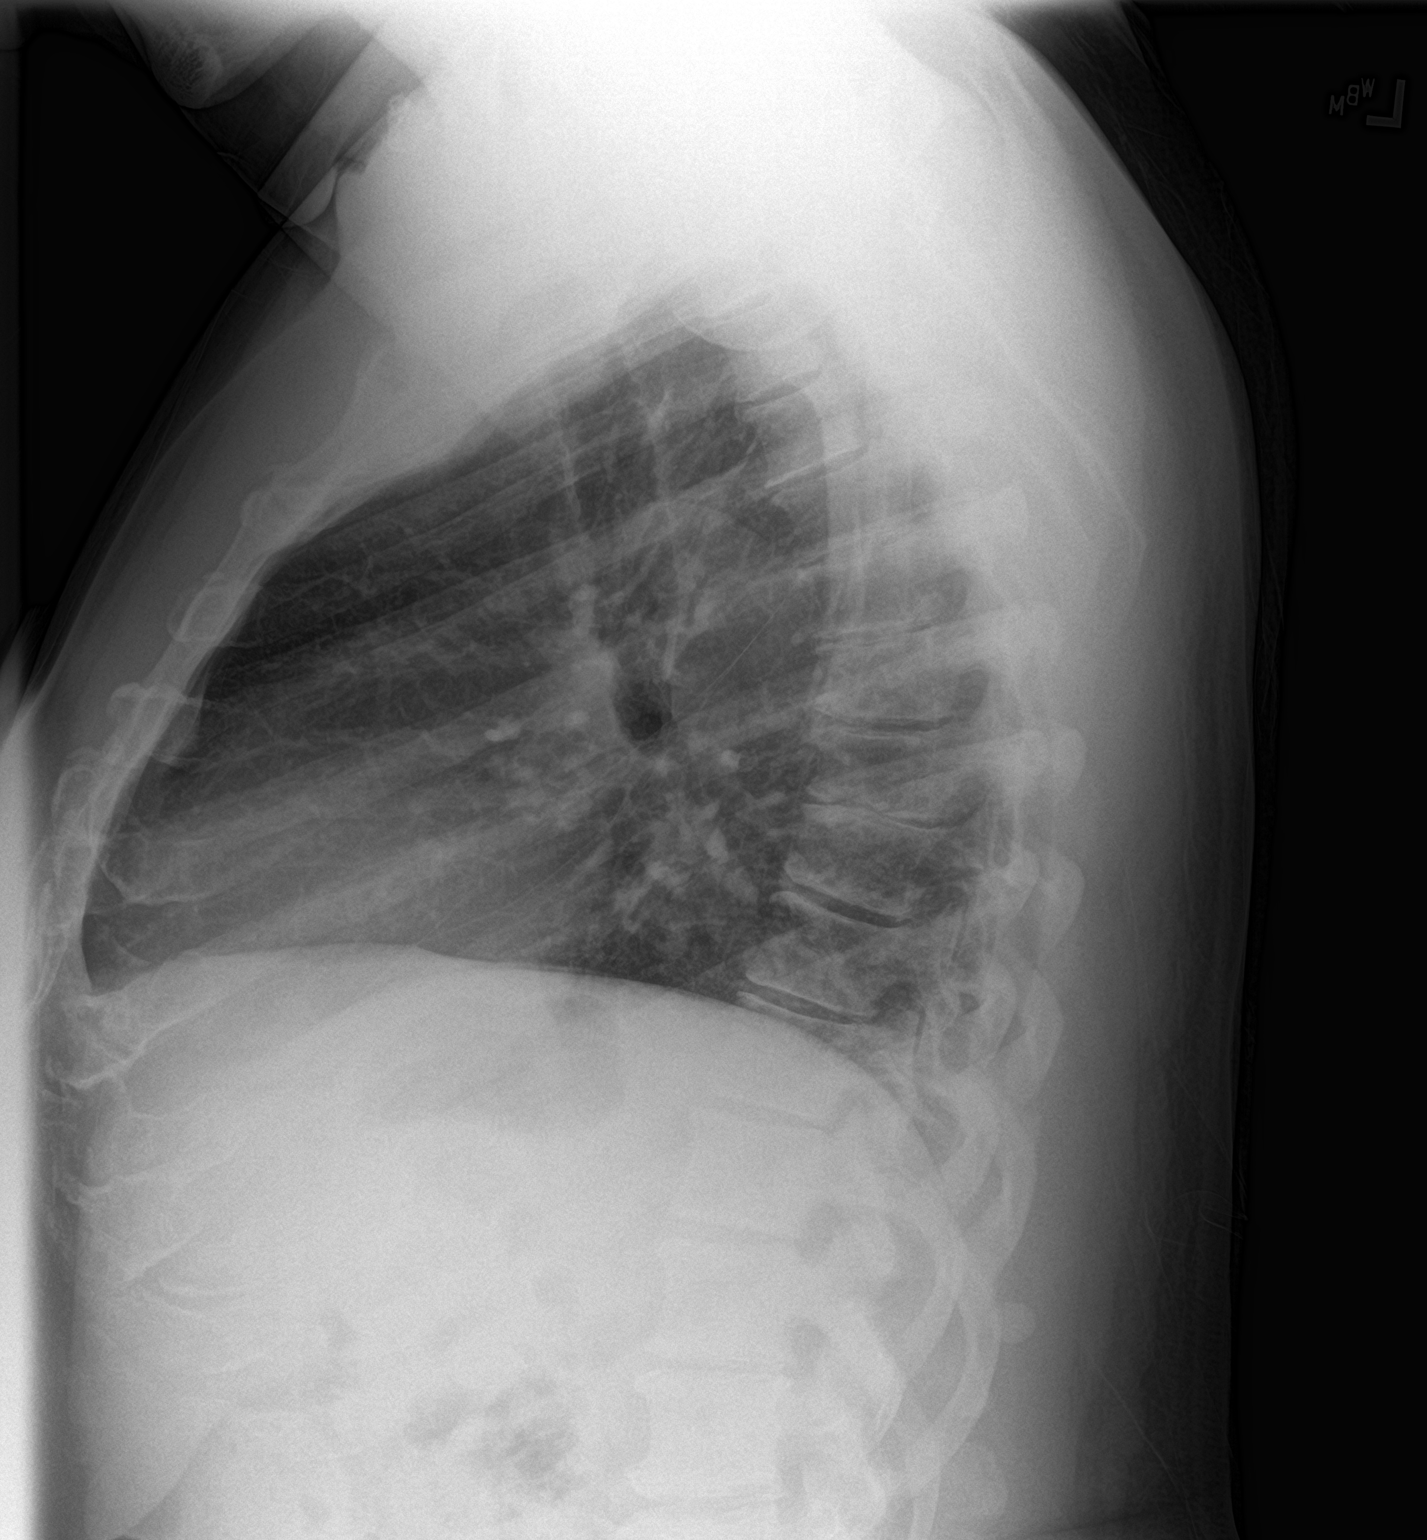

[2 of 2 positions shown; findings below may reference images not displayed]

FINDINGS: No pneumothorax. A calcified granuloma in the right perihilar region
is again identified. The left lung is clear. Minimal opacity in the
right base could represent a tiny amount of residual infiltrate
versus atelectasis or scar. The cardiomediastinal silhouette is
normal.
IMPRESSION: Minimal opacity in the right base could represent atelectasis or
minimal residual infiltrate given history. Recommend follow-up to
complete resolution.

## 2019-05-18 ENCOUNTER — Ambulatory Visit: Payer: Managed Care, Other (non HMO) | Admitting: Nurse Practitioner

## 2020-02-24 ENCOUNTER — Other Ambulatory Visit: Payer: Self-pay | Admitting: Internal Medicine

## 2020-02-24 DIAGNOSIS — G4700 Insomnia, unspecified: Secondary | ICD-10-CM

## 2020-08-23 ENCOUNTER — Telehealth: Payer: Self-pay

## 2020-08-23 NOTE — Telephone Encounter (Signed)
Copied from CRM [id number]. Topic: General - Other >> Aug 23, 2020 11:41 AM Kevin Marshall wrote: Reason for CRM: patient inquiring if he received his HEP B Vaccine, please advise

## 2020-08-23 NOTE — Telephone Encounter (Signed)
Called patient no HEP  B on immunization on file

## 2024-06-09 ENCOUNTER — Other Ambulatory Visit: Payer: Self-pay | Admitting: Medical Genetics

## 2024-07-01 ENCOUNTER — Other Ambulatory Visit: Payer: Self-pay | Admitting: Medical Genetics

## 2024-07-01 DIAGNOSIS — Z006 Encounter for examination for normal comparison and control in clinical research program: Secondary | ICD-10-CM

## 2024-08-14 LAB — GENECONNECT MOLECULAR SCREEN

## 2024-08-18 ENCOUNTER — Other Ambulatory Visit: Payer: Self-pay | Admitting: Medical Genetics

## 2024-08-18 ENCOUNTER — Telehealth: Payer: Self-pay | Admitting: Medical Genetics

## 2024-08-18 DIAGNOSIS — Z006 Encounter for examination for normal comparison and control in clinical research program: Secondary | ICD-10-CM

## 2024-08-18 NOTE — Telephone Encounter (Signed)
 [facility]  08/18/2024  1:24 PM  Confirmed I was speaking with Kevin Marshall [phone] by using name and DOB. Informed participant the reason for this call is to follow-up on a recent sample the participant provided at one of the [facility] lab locations. Informed participant the test was not able to be completed with this sample and apologized for the inconvenience. Participant was requested to provide a new sample at one of our participating labs at no cost so that participant can continue participation and receive test results. Informed participant they do not need to be fasting and if there are other samples that need to be drawn, they can be done at the same visit. Participant has not had a blood transfusion or blood product in the last 30 days. Participant agreed to provide another sample. Participant was provided the [employer] program website to learn why this may have happened. Participant was thanked for their time and continued support of the above study.    Jordyn Pennstrom, BS [place]  [facility] Clinical Research Specialist II Direct Dial: [phone]  Fax: [phone]

## 2024-09-18 LAB — GENECONNECT MOLECULAR SCREEN: Genetic Analysis Overall Interpretation: NEGATIVE
